# Patient Record
Sex: Male | Born: 1960 | Race: Black or African American | Hispanic: No | Smoking: Current every day smoker
Health system: Southern US, Community
[De-identification: ages and names within clinical notes are randomized; demographics above are authoritative.]

## PROBLEM LIST (undated history)

## (undated) DIAGNOSIS — Z87442 Personal history of urinary calculi: Secondary | ICD-10-CM

## (undated) DIAGNOSIS — Z8659 Personal history of other mental and behavioral disorders: Secondary | ICD-10-CM

## (undated) HISTORY — DX: Personal history of other mental and behavioral disorders: Z86.59

---

## 2010-04-28 ENCOUNTER — Ambulatory Visit: Payer: Self-pay | Admitting: General Practice

## 2010-05-19 ENCOUNTER — Ambulatory Visit: Payer: Self-pay | Admitting: Urology

## 2010-06-01 ENCOUNTER — Ambulatory Visit: Payer: Self-pay | Admitting: Urology

## 2011-08-01 HISTORY — PX: STONE EXTRACTION WITH BASKET: SHX5318

## 2015-02-13 ENCOUNTER — Observation Stay
Admission: EM | Admit: 2015-02-13 | Discharge: 2015-02-13 | Disposition: A | Discharge status: Home / Self Care | Payer: Self-pay | Attending: Internal Medicine | Admitting: Internal Medicine

## 2015-02-13 ENCOUNTER — Encounter: Payer: Self-pay | Admitting: Occupational Medicine

## 2015-02-13 ENCOUNTER — Observation Stay: Payer: Self-pay

## 2015-02-13 ENCOUNTER — Other Ambulatory Visit: Payer: Self-pay

## 2015-02-13 ENCOUNTER — Emergency Department: Payer: Self-pay

## 2015-02-13 DIAGNOSIS — I10 Essential (primary) hypertension: Secondary | ICD-10-CM | POA: Insufficient documentation

## 2015-02-13 DIAGNOSIS — E785 Hyperlipidemia, unspecified: Secondary | ICD-10-CM | POA: Insufficient documentation

## 2015-02-13 DIAGNOSIS — F17200 Nicotine dependence, unspecified, uncomplicated: Secondary | ICD-10-CM | POA: Insufficient documentation

## 2015-02-13 DIAGNOSIS — R079 Chest pain, unspecified: Principal | ICD-10-CM | POA: Insufficient documentation

## 2015-02-13 LAB — BASIC METABOLIC PANEL
ANION GAP: 8 (ref 5–15)
BUN: 11 mg/dL (ref 6–20)
CO2: 26 mmol/L (ref 22–32)
Calcium: 8.8 mg/dL — ABNORMAL LOW (ref 8.9–10.3)
Chloride: 104 mmol/L (ref 101–111)
Creatinine, Ser: 0.99 mg/dL (ref 0.61–1.24)
Glucose, Bld: 118 mg/dL — ABNORMAL HIGH (ref 65–99)
POTASSIUM: 3.7 mmol/L (ref 3.5–5.1)
Sodium: 138 mmol/L (ref 135–145)

## 2015-02-13 LAB — CBC
HEMATOCRIT: 41.2 % (ref 40.0–52.0)
Hemoglobin: 13.7 g/dL (ref 13.0–18.0)
MCH: 30.7 pg (ref 26.0–34.0)
MCHC: 33.2 g/dL (ref 32.0–36.0)
MCV: 92.3 fL (ref 80.0–100.0)
Platelets: 352 10*3/uL (ref 150–440)
RBC: 4.46 MIL/uL (ref 4.40–5.90)
RDW: 14.4 % (ref 11.5–14.5)
WBC: 14.1 10*3/uL — ABNORMAL HIGH (ref 3.8–10.6)

## 2015-02-13 LAB — TROPONIN I
Troponin I: <0.03 ng/mL (ref <0.031)
Troponin I: <0.03 ng/mL (ref <0.031)

## 2015-02-13 LAB — HEMOGLOBIN A1C: Hgb A1c MFr Bld: 5.6 % (ref 4.0–6.0)

## 2015-02-13 LAB — TSH: TSH: 1.804 u[IU]/mL (ref 0.350–4.500)

## 2015-02-13 LAB — LIPASE, BLOOD: Lipase: 19 U/L — ABNORMAL LOW (ref 22–51)

## 2015-02-13 LAB — MAGNESIUM: Magnesium: 1.5 mg/dL — ABNORMAL LOW (ref 1.7–2.4)

## 2015-02-13 MED ORDER — SODIUM CHLORIDE 0.9 % IJ SOLN
3.0000 mL | Freq: Two times a day (BID) | INTRAMUSCULAR | Status: DC
  Administered 2015-02-13: 3 mL via INTRAVENOUS

## 2015-02-13 MED ORDER — ONDANSETRON HCL 4 MG/2ML IJ SOLN: 4.0000 mg | Freq: Four times a day (QID) | INTRAMUSCULAR | Status: DC | PRN

## 2015-02-13 MED ORDER — NITROGLYCERIN 0.4 MG SL SUBL: 0.4000 mg | SUBLINGUAL_TABLET | SUBLINGUAL | Status: DC | PRN

## 2015-02-13 MED ORDER — MORPHINE SULFATE 4 MG/ML IJ SOLN
INTRAMUSCULAR | Status: AC
  Administered 2015-02-13: 4 mg via INTRAVENOUS
  Filled 2015-02-13: qty 1

## 2015-02-13 MED ORDER — MORPHINE SULFATE 2 MG/ML IJ SOLN
2.0000 mg | Freq: Once | INTRAMUSCULAR | Status: AC
Start: 2015-02-13 — End: 2015-02-13
  Administered 2015-02-13: 2 mg via INTRAVENOUS

## 2015-02-13 MED ORDER — ASPIRIN EC 81 MG PO TBEC: 81.0000 mg | DELAYED_RELEASE_TABLET | Freq: Every day | ORAL | Status: DC

## 2015-02-13 MED ORDER — SODIUM CHLORIDE 0.9 % IJ SOLN: 3.0000 mL | INTRAMUSCULAR | Status: DC | PRN

## 2015-02-13 MED ORDER — SODIUM CHLORIDE 0.9 % IV BOLUS (SEPSIS)
1000.0000 mL | Freq: Once | INTRAVENOUS | Status: AC
  Administered 2015-02-13: 1000 mL via INTRAVENOUS

## 2015-02-13 MED ORDER — NICOTINE 14 MG/24HR TD PT24
14.0000 mg | MEDICATED_PATCH | Freq: Every day | TRANSDERMAL | Status: DC
  Administered 2015-02-13: 14 mg via TRANSDERMAL
  Filled 2015-02-13: qty 1

## 2015-02-13 MED ORDER — MORPHINE SULFATE 4 MG/ML IJ SOLN
4.0000 mg | Freq: Once | INTRAMUSCULAR | Status: AC
  Administered 2015-02-13: 4 mg via INTRAVENOUS

## 2015-02-13 MED ORDER — SODIUM CHLORIDE 0.9 % IV SOLN: 250.0000 mL | INTRAVENOUS | Status: DC | PRN

## 2015-02-13 MED ORDER — TECHNETIUM TC 99M SESTAMIBI - CARDIOLITE
28.3000 | Freq: Once | INTRAVENOUS | Status: AC | PRN
  Administered 2015-02-13: 10:00:00 28.3 via INTRAVENOUS

## 2015-02-13 MED ORDER — ONDANSETRON HCL 4 MG/2ML IJ SOLN
INTRAMUSCULAR | Status: AC
  Administered 2015-02-13: 4 mg via INTRAVENOUS
  Filled 2015-02-13: qty 2

## 2015-02-13 MED ORDER — MAGNESIUM OXIDE 400 (241.3 MG) MG PO TABS
800.0000 mg | ORAL_TABLET | Freq: Once | ORAL | Status: AC
  Administered 2015-02-13: 800 mg via ORAL
  Filled 2015-02-13: qty 2

## 2015-02-13 MED ORDER — ACETAMINOPHEN 325 MG PO TABS: 650.0000 mg | ORAL_TABLET | ORAL | Status: DC | PRN

## 2015-02-13 MED ORDER — ASPIRIN 81 MG PO TBEC
81.0000 mg | DELAYED_RELEASE_TABLET | Freq: Every day | ORAL | Status: DC
Start: 2015-02-14 — End: 2017-06-27

## 2015-02-13 MED ORDER — ONDANSETRON HCL 4 MG/2ML IJ SOLN
4.0000 mg | Freq: Once | INTRAMUSCULAR | Status: AC
Start: 2015-02-13 — End: 2015-02-13
  Administered 2015-02-13: 4 mg via INTRAVENOUS

## 2015-02-13 MED ORDER — NICOTINE 14 MG/24HR TD PT24: 14.0000 mg | MEDICATED_PATCH | Freq: Every day | TRANSDERMAL | Status: DC

## 2015-02-13 MED ORDER — MORPHINE SULFATE 2 MG/ML IJ SOLN
INTRAMUSCULAR | Status: AC
  Administered 2015-02-13: 2 mg via INTRAVENOUS
  Filled 2015-02-13: qty 1

## 2015-02-13 MED ORDER — ENOXAPARIN SODIUM 40 MG/0.4ML ~~LOC~~ SOLN
40.0000 mg | SUBCUTANEOUS | Status: DC
  Administered 2015-02-13: 40 mg via SUBCUTANEOUS
  Filled 2015-02-13 (×3): qty 0.4

## 2015-02-13 MED ORDER — PNEUMOCOCCAL VAC POLYVALENT 25 MCG/0.5ML IJ INJ: 0.5000 mL | INJECTION | INTRAMUSCULAR | Status: DC

## 2015-02-13 MED ORDER — ATORVASTATIN CALCIUM 20 MG PO TABS: 40.0000 mg | ORAL_TABLET | Freq: Every day | ORAL | Status: DC

## 2015-02-13 MED ORDER — TECHNETIUM TC 99M SESTAMIBI - CARDIOLITE
13.8000 | Freq: Once | INTRAVENOUS | Status: AC | PRN
  Administered 2015-02-13: 13.8 via INTRAVENOUS

## 2015-02-13 NOTE — Progress Notes (Signed)
Pt in NAD, skin warm and dry, VSS, SR per monitor.  Denies pain or discomfort at this time.  IV and telemetry discontinued per policy and procedure.  Discharge instructions and Rx's given to and reviewed with patient.  Pt  verbalized understanding.  Pt discharged home.

## 2015-02-13 NOTE — ED Notes (Signed)
Pt presents via EMS for mid CP since 2am non radiating denies sob, dizzy, nausea or vomiting. Pt states hurts to breathe feels like air bubble in chest. Pain 7/10. ASA 324mg  given by EMS. V/s stable

## 2015-02-13 NOTE — Progress Notes (Signed)
Chest pain without mi Stress test nomral -no further cardiac wu -ok for dc to home from cardiac standpoint

## 2015-02-13 NOTE — Discharge Instructions (Signed)
Heart healthy diet. °Activity as tolerated. °

## 2015-02-13 NOTE — Consult Note (Signed)
[facility] Cardiology Consultation Note  Patient ID: David Bungert., MRN: [id number], DOB/AGE: 53-30-1962 53 y.o. Admit date: 02/13/2015   Date of Consult: 02/13/2015 Primary Physician: Luna Fuse, MD Primary Cardiologist:khan  Chief Complaint:  Chief Complaint  Patient presents with  . Chest Pain   Reason for Consult: chest pain  HPI: 54 y.o. male with no evidence of significant cardiovascular disease in the past or significant risk factors of cardiovascular disease other than hyperlipidemia who is had new onset of chest discomfort substernal in nature nature radiating into the neck associated with some physical activity although currently at rest. This is lasted for several hours and a nagging pain for which did not go away. The patient has had an EKG on admission to the hospital showing normal sinus rhythm. Normal EKG. Troponin levels were normal and no current evidence of myocardial infarction. The patient has had provement's of symptoms at this time  History reviewed. No pertinent past medical history.    Surgical History: History reviewed. No pertinent past surgical history.   Home Meds: Prior to Admission medications   Not on File    Inpatient Medications:  . [START ON 02/14/2015] aspirin EC  81 mg Oral Daily  . atorvastatin  40 mg Oral q1800  . enoxaparin (LOVENOX) injection  40 mg Subcutaneous Q24H  . nicotine  14 mg Transdermal Daily  . sodium chloride  3 mL Intravenous Q12H      Allergies: No Known Allergies  History   Social History  . Marital Status: Married    Spouse Name: N/A  . Number of Children: N/A  . Years of Education: N/A   Occupational History  . Not on file.   Social History Main Topics  . Smoking status: Current Every Day Smoker -- 0.50 packs/day for 25 years    Types: Cigarettes  . Smokeless tobacco: Not on file  . Alcohol Use: No  . Drug Use: No  . Sexual Activity: Not on file   Other Topics Concern  . Not on file    Social History Narrative  . No narrative on file     Family History  Problem Relation Age of Onset  . Heart failure Mother      Review of Systems Positive for chest pain Negative for: General:  chills, fever, night sweats or weight changes.  Cardiovascular: PND orthopnea syncope dizziness  Dermatological skin lesions rashes Respiratory: Cough congestion Urologic: Frequent urination urination at night and hematuria Abdominal: negative for nausea, vomiting, diarrhea, bright red blood per rectum, melena, or hematemesis Neurologic: negative for visual changes, and/or hearing changes  All other systems reviewed and are otherwise negative except as noted above.  Labs:  Recent Labs  02/13/15 0524  TROPONINI <0.03   Lab Results  Component Value Date   WBC 14.1* 02/13/2015   HGB 13.7 02/13/2015   HCT 41.2 02/13/2015   MCV 92.3 02/13/2015   PLT 352 02/13/2015    Recent Labs Lab 02/13/15 0524  NA 138  K 3.7  CL 104  CO2 26  BUN 11  CREATININE 0.99  CALCIUM 8.8*  GLUCOSE 118*   No results found for: CHOL, HDL, LDLCALC, TRIG No results found for: DDIMER  Radiology/Studies:  Dg Chest 2 View  02/13/2015   CLINICAL DATA:  Mid chest pain beginning yesterday, indigestion.  EXAM: CHEST  2 VIEW  COMPARISON:  None.  FINDINGS: Cardiac silhouette is normal in size, mediastinal silhouette is nonsuspicious. Bronchitic changes, confluent in the lung  bases. No pleural effusion. No pneumothorax. Soft tissue planes and included osseous structure nonsuspicious.  IMPRESSION: Bronchitic changes, confluent in the lung bases which can be seen with fibrosis, atelectasis or less likely pneumonia.   Electronically Signed   By: Awilda Metroourtnay  Bloomer M.D.   On: 02/13/2015 05:55    EKG: Normal sinus rhythm. Normal EKG  Weights: Filed Weights   02/13/15 0537  Weight: 160 lb (72.576 kg)     Physical Exam: Blood pressure 119/95, pulse 93, temperature 97.9 F (36.6 C), temperature source Oral,  resp. rate 20, height 5\' 9"  (1.753 m), weight 160 lb (72.576 kg), SpO2 100 %. Body mass index is 23.62 kg/(m^2). General: Well developed, well nourished, in no acute distress. Head eyes ears nose throat: Normocephalic, atraumatic, sclera non-icteric, no xanthomas, nares are without discharge. No apparent thyromegaly and/or mass  Lungs: Normal respiratory effort.  no wheezes, no rales, no rhonchi.  Heart: RRR with normal S1 S2. no murmur gallop, no rub, PMI is normal size and placement, carotid upstroke normal without bruit, jugular venous pressure is normal Abdomen: Soft, non-tender, non-distended with normoactive bowel sounds. No hepatomegaly. No rebound/guarding. No obvious abdominal masses. Abdominal aorta is normal size without bruit Extremities: No edema. no cyanosis, no clubbing, no ulcers  Peripheral : 2+ bilateral upper extremity pulses, 2+ bilateral femoral pulses, 2+ bilateral dorsal pedal pulse Neuro: Alert and oriented. No facial asymmetry. No focal deficit. Moves all extremities spontaneously. Musculoskeletal: Normal muscle tone without kyphosis Psych:  Responds to questions appropriately with a normal affect.    Assessment: 54 year old male with known hyperlipidemia Hypertension with acute onset of atypical chest discomfort without current evidence of myocardial infarction  Plan: 1. Aspirin for further risk reduction cardiovascular event 2. Serial ECG and enzymes to assess for myocardial infarction 3. Consider treadmill stress test to assess for myocardial ischemia 4. High intensity cholesterol therapy with atorvastatin 5. Further diagnostic testing and treatment options after above  Signed, Lamar BlinksKOWALSKI,Danon Lograsso J M.D. [facility] 02/13/2015, 9:38 AM

## 2015-02-13 NOTE — H&P (Signed)
[facility] [place] [facility]   PATIENT NAME: David Duffy    MR#:  [id number]  DATE OF BIRTH:  Aug 29, 1960  DATE OF ADMISSION:  02/13/2015  PRIMARY CARE PHYSICIAN: Luna FuseEJAN-SIE, SHEIKH AHMED, MD   REQUESTING/REFERRING PHYSICIAN: Darci Currentandolph N Brown, MD  CHIEF COMPLAINT:   Chief Complaint  Patient presents with  . Chest Pain  chest pain today.  HISTORY OF PRESENT ILLNESS:  David Duffy  is a 54 y.o. male with no medical history. Chest pain since 2 am today, which is nagging, substernal area, constant without radiation, exacerbated by deep breathing. He denies any other symptoms. Dr. Manson PasseyBrown said the patient has heart murmurs. He needs to be admitted to rule out ACS. The patient has stress due to family issue recently.  PAST MEDICAL HISTORY:  History reviewed. No pertinent past medical history. No  PAST SURGICAL HISTORY:  History reviewed. No pertinent past surgical history. No  SOCIAL HISTORY:   History  Substance Use Topics  . Smoking status: Current Every Day Smoker -- 0.50 packs/day for 25 years    Types: Cigarettes  . Smokeless tobacco: Not on file  . Alcohol Use: No    FAMILY HISTORY:   Family History  Problem Relation Age of Onset  . Heart failure Mother   Father: prostate cancer.  DRUG ALLERGIES:  No Known Allergies  REVIEW OF SYSTEMS:  CONSTITUTIONAL: No fever, fatigue or weakness.  EYES: No blurred or double vision.  EARS, NOSE, AND THROAT: No tinnitus or ear pain.  RESPIRATORY: No cough, shortness of breath, wheezing or hemoptysis.  CARDIOVASCULAR: Haschest pain, no orthopnea, edema.  GASTROINTESTINAL: No nausea, vomiting, diarrhea or abdominal pain.  GENITOURINARY: No dysuria, hematuria.  ENDOCRINE: No polyuria, nocturia,  HEMATOLOGY: No anemia, easy bruising or bleeding SKIN: No rash or lesion. MUSCULOSKELETAL: No joint pain or arthritis.   NEUROLOGIC: No tingling, numbness, weakness.  PSYCHIATRY: No anxiety or depression.    MEDICATIONS AT HOME:   Prior to Admission medications   Not on File      VITAL SIGNS:  Blood pressure 119/95, pulse 93, temperature 97.9 F (36.6 C), temperature source Oral, resp. rate 20, height 5\' 9"  (1.753 m), weight 72.576 kg (160 lb), SpO2 100 %.  PHYSICAL EXAMINATION:  GENERAL:  54 y.o.-year-old patient lying in the bed with no acute distress.  EYES: Pupils equal, round, reactive to light and accommodation. No scleral icterus. Extraocular muscles intact.  HEENT: Head atraumatic, normocephalic. Oropharynx and nasopharynx clear.  NECK:  Supple, no jugular venous distention. No thyroid enlargement, no tenderness.  LUNGS: Normal breath sounds bilaterally, no wheezing, rales,rhonchi or crepitation. No use of accessory muscles of respiration.  CARDIOVASCULAR: S1, S2 normal. No murmurs, rubs, or gallops.  ABDOMEN: Soft, nontender, nondistended. Bowel sounds present. No organomegaly or mass.  EXTREMITIES: No pedal edema, cyanosis, or clubbing.  NEUROLOGIC: Cranial nerves II through XII are intact. Muscle strength 5/5 in all extremities. Sensation intact. Gait not checked.  PSYCHIATRIC: The patient is alert and oriented x 3.  SKIN: No obvious rash, lesion, or ulcer.   LABORATORY PANEL:   CBC  Recent Labs Lab 02/13/15 0604  WBC 14.1*  HGB 13.7  HCT 41.2  PLT 352   ------------------------------------------------------------------------------------------------------------------  Chemistries   Recent Labs Lab 02/13/15 0524  NA 138  K 3.7  CL 104  CO2 26  GLUCOSE 118*  BUN 11  CREATININE 0.99  CALCIUM 8.8*   ------------------------------------------------------------------------------------------------------------------  Cardiac Enzymes  Recent Labs Lab 02/13/15 0524  TROPONINI <  0.03   ------------------------------------------------------------------------------------------------------------------  RADIOLOGY:  Dg Chest 2 View  02/13/2015   CLINICAL  DATA:  Mid chest pain beginning yesterday, indigestion.  EXAM: CHEST  2 VIEW  COMPARISON:  None.  FINDINGS: Cardiac silhouette is normal in size, mediastinal silhouette is nonsuspicious. Bronchitic changes, confluent in the lung bases. No pleural effusion. No pneumothorax. Soft tissue planes and included osseous structure nonsuspicious.  IMPRESSION: Bronchitic changes, confluent in the lung bases which can be seen with fibrosis, atelectasis or less likely pneumonia.   Electronically Signed   By: Awilda Metro M.D.   On: 02/13/2015 05:55    EKG:   Orders placed or performed during the hospital encounter of 02/13/15  . ED EKG within 10 minutes  . ED EKG within 10 minutes    IMPRESSION AND PLAN:   Chest pain, R/O ACS Leukocytosis Tobacco abuse.  Tele monitor, follow up troponin, continue ASA, start lipitor and lopressor. Get stress test, echo and cardiology consult. Smoking cessation was counselled for 4 min.   All the records are reviewed and case discussed with ED provider. Management plans discussed with the patient, his wife and they are in agreement.  CODE STATUS: Full Code.  TOTAL TIME TAKING CARE OF THIS PATIENT: 48  minutes.    Shaune Pollack M.D on 02/13/2015 at 7:35 AM  Between 7am to 6pm - Pager - [phone]  After 6pm go to [email or web address] - password [email or web address] [facility]  [facility]  Office  [phone]  CC: Primary care physician; Luna Fuse, MD

## 2015-02-13 NOTE — Discharge Summary (Addendum)
[facility] - [place] at [facility]   PATIENT NAME: David Duffy    MR#:  [id number]  DATE OF BIRTH:  1960-11-17  DATE OF ADMISSION:  02/13/2015 ADMITTING PHYSICIAN: Shaune Pollack, MD  DATE OF DISCHARGE: 02/13/2015 PRIMARY CARE PHYSICIAN: Luna Fuse, MD    ADMISSION DIAGNOSIS:  Chest pain [R07.9] Chest pain, unspecified chest pain type [R07.9]   DISCHARGE DIAGNOSIS:  Atypical chest pain Hypomagnesemia Tobacco abuse.  SECONDARY DIAGNOSIS:  History reviewed. No pertinent past medical history.  HOSPITAL COURSE:   The patient was admitted for chest pain this morning. He was treated with aspirin and Lipitor. Troponins are negative. Stress test is negative. According to Dr. Gwen Pounds, patient can be discharged to home today. Patient's magnesium is 1.5. He is treated with magnesium supplement. Smoking cessation was counseled and he was given nicotine patch. DISCHARGE CONDITIONS:   Stable.  CONSULTS OBTAINED:  Treatment Team:  Lamar Blinks, MD  DRUG ALLERGIES:  No Known Allergies  DISCHARGE MEDICATIONS:   Current Discharge Medication List    START taking these medications   Details  aspirin EC 81 MG EC tablet Take 1 tablet (81 mg total) by mouth daily. Qty: 30 tablet, Refills: 0    nicotine (NICODERM CQ - DOSED IN MG/24 HOURS) 14 mg/24hr patch Place 1 patch (14 mg total) onto the skin daily. Qty: 28 patch, Refills: 0         DISCHARGE INSTRUCTIONS:    If you experience worsening of your admission symptoms, develop shortness of breath, life threatening emergency, suicidal or homicidal thoughts you must seek medical attention immediately by calling 911 or calling your MD immediately  if symptoms less severe.  You Must read complete instructions/literature along with all the possible adverse reactions/side effects for all the Medicines you take and that have been prescribed to you. Take any new Medicines after you have completely  understood and accept all the possible adverse reactions/side effects.   Please note  You were cared for by a hospitalist during your hospital stay. If you have any questions about your discharge medications or the care you received while you were in the hospital after you are discharged, you can call the unit and asked to speak with the hospitalist on call if the hospitalist that took care of you is not available. Once you are discharged, your primary care physician will handle any further medical issues. Please note that NO REFILLS for any discharge medications will be authorized once you are discharged, as it is imperative that you return to your primary care physician (or establish a relationship with a primary care physician if you do not have one) for your aftercare needs so that they can reassess your need for medications and monitor your lab values.    Today   SUBJECTIVE   No complaint.   VITAL SIGNS:  Blood pressure 107/64, pulse 85, temperature 100 F (37.8 C), temperature source Oral, resp. rate 20, height  (1.778 m), weight 76.749 kg (169 lb 3.2 oz), SpO2 98 %.  I/O:  No intake or output data in the 24 hours ending 02/13/15 1337  PHYSICAL EXAMINATION:  GENERAL:  54 y.o.-year-old patient lying in the bed with no acute distress.  EYES: Pupils equal, round, reactive to light and accommodation. No scleral icterus. Extraocular muscles intact.  HEENT: Head atraumatic, normocephalic. Oropharynx and nasopharynx clear.  NECK:  Supple, no jugular venous distention. No thyroid enlargement, no tenderness.  LUNGS: Normal breath sounds bilaterally, no wheezing,  rales,rhonchi or crepitation. No use of accessory muscles of respiration.  CARDIOVASCULAR: S1, S2 normal. No murmurs, rubs, or gallops.  ABDOMEN: Soft, non-tender, non-distended. Bowel sounds present. No organomegaly or mass.  EXTREMITIES: No pedal edema, cyanosis, or clubbing.  NEUROLOGIC: Cranial nerves II through XII are  intact. Muscle strength 5/5 in all extremities. Sensation intact. Gait not checked.  PSYCHIATRIC: The patient is alert and oriented x 3.  SKIN: No obvious rash, lesion, or ulcer.   DATA REVIEW:   CBC  Recent Labs Lab 02/13/15 0604  WBC 14.1*  HGB 13.7  HCT 41.2  PLT 352    Chemistries   Recent Labs Lab 02/13/15 0524 02/13/15 0836  NA 138  --   K 3.7  --   CL 104  --   CO2 26  --   GLUCOSE 118*  --   BUN 11  --   CREATININE 0.99  --   CALCIUM 8.8*  --   MG  --  1.5*    Cardiac Enzymes  Recent Labs Lab 02/13/15 0836  TROPONINI <0.03    Microbiology Results  No results found for this or any previous visit.  RADIOLOGY:  Dg Chest 2 View  02/13/2015   CLINICAL DATA:  Mid chest pain beginning yesterday, indigestion.  EXAM: CHEST  2 VIEW  COMPARISON:  None.  FINDINGS: Cardiac silhouette is normal in size, mediastinal silhouette is nonsuspicious. Bronchitic changes, confluent in the lung bases. No pleural effusion. No pneumothorax. Soft tissue planes and included osseous structure nonsuspicious.  IMPRESSION: Bronchitic changes, confluent in the lung bases which can be seen with fibrosis, atelectasis or less likely pneumonia.   Electronically Signed   By: Awilda Metroourtnay  Bloomer M.D.   On: 02/13/2015 05:55        Management plans discussed with the patient, family and they are in agreement.  CODE STATUS:     Code Status Orders        Start     Ordered   02/13/15 0820  Full code   Continuous     02/13/15 0819      TOTAL TIME TAKING CARE OF THIS PATIENT: 33 minutes.    Shaune Pollackhen, Dorissa Stinnette M.D on 02/13/2015 at 1:37 PM  Between 7am to 6pm - Pager - [phone]  After 6pm go to [email or web address] - password [email or web address] [facility]  [facility]  Office  [phone]  CC: Primary care physician; Luna FuseEJAN-SIE, SHEIKH AHMED, MD

## 2015-02-13 NOTE — ED Provider Notes (Signed)
[facility] Emergency Department Provider Note  ____________________________________________  Time seen: 6:00 AM  I have reviewed the triage vital signs and the nursing notes.   HISTORY  Chief Complaint Chest Pain      HPI David Greavesndrew J Marlett Sr. is a 54 y.o. male presents with acute onset of chest pain at approximately 2 AM this morning that is nonradiating. Patient describes the pain as pressure 10 out of 10. Patient denies any dyspnea no nausea no vomiting or dizziness. Patient was given aspirin during 24 mg by EMS prior to presentation. Patient denies any past medical history     Past medical history None  Past surgical history None  Allergies Review of patient's allergies indicates no known allergies.  Family History  Problem Relation Age of Onset  . Heart failure Mother     Social History History  Substance Use Topics  . Smoking status: Not on file  . Smokeless tobacco: Not on file  . Alcohol Use: Not on file    Review of Systems  Constitutional: Negative for fever. Eyes: Negative for visual changes. ENT: Negative for sore throat. Cardiovascular: Positive for chest pain. Respiratory: Negative for shortness of breath. Gastrointestinal: Negative for abdominal pain, vomiting and diarrhea. Genitourinary: Negative for dysuria. Musculoskeletal: Negative for back pain. Skin: Negative for rash. Neurological: Negative for headaches, focal weakness or numbness.   10-point ROS otherwise negative.  ____________________________________________   PHYSICAL EXAM:  VITAL SIGNS: ED Triage Vitals  Enc Vitals Group     BP 02/13/15 0537 151/88 mmHg     Pulse Rate 02/13/15 0537 73     Resp 02/13/15 0537 18     Temp 02/13/15 0537 97.9 F (36.6 C)     Temp Source 02/13/15 0537 Oral     SpO2 02/13/15 0537 99 %     Weight 02/13/15 0537 160 lb (72.576 kg)     Height 02/13/15 0537 5\' 9"  (1.753 m)     Head Cir --      Peak Flow --      Pain  Score 02/13/15 0538 7     Pain Loc --      Pain Edu? --      Excl. in GC? --     Constitutional: Alert and oriented. Well appearing and in no distress. Eyes: Conjunctivae are normal. PERRL. Normal extraocular movements. ENT   Head: Normocephalic and atraumatic.   Nose: No congestion/rhinnorhea.   Mouth/Throat: Mucous membranes are moist.   Neck: No stridor. Hematological/Lymphatic/Immunilogical: No cervical lymphadenopathy. Cardiovascular: Normal rate, regular rhythm. Normal and symmetric distal pulses are present in all extremities. Grade 3 crescendo decrescendo murmur noted right sternal border Respiratory: Normal respiratory effort without tachypnea nor retractions. Breath sounds are clear and equal bilaterally. No wheezes/rales/rhonchi. Gastrointestinal: Soft and nontender. No distention. There is no CVA tenderness. Genitourinary: deferred Musculoskeletal: Nontender with normal range of motion in all extremities. No joint effusions.  No lower extremity tenderness nor edema. Neurologic:  Normal speech and language. No gross focal neurologic deficits are appreciated. Speech is normal.  Skin:  Skin is warm, dry and intact. No rash noted. Psychiatric: Mood and affect are normal. Speech and behavior are normal. Patient exhibits appropriate insight and judgment.  ____________________________________________    LABS (pertinent positives/negatives)    ____________________________________________   EKG  ED ECG REPORT I, Mataya Kilduff, Vickery N, the attending physician, personally viewed and interpreted this ECG.   Date: 02/13/2015  EKG Time: 5:22 AM  Rate: 64  Rhythm: Normal sinus  rhythm  Axis: Normal  Intervals: Normal  ST&T Change: None   ____________________________________________    RADIOLOGY  Chest x-ray revealed:  IMPRESSION: Bronchitic changes, confluent in the lung bases which can be seen with fibrosis, atelectasis or less likely  pneumonia.   Electronically Signed By: Awilda Metro M.D.   INITIAL IMPRESSION / ASSESSMENT AND PLAN / ED COURSE  Pertinent labs & imaging results that were available during my care of the patient were reviewed by me and considered in my medical decision making (see chart for details).  Given history of physical exam (new diagnosis of a murmur) concern for cardiac etiology of the patient's chest pain as such patient will be admitted to Dr. Imogene Duffy for further cardiac evaluation.  ____________________________________________   FINAL CLINICAL IMPRESSION(S) / ED DIAGNOSES  Final diagnoses:  Chest pain, unspecified chest pain type      Darci Current, MD 02/13/15 [phone]

## 2015-03-09 LAB — NM MYOCAR MULTI W/SPECT W/WALL MOTION / EF
CHL CUP NUCLEAR SRS: 0
CHL CUP NUCLEAR SSS: 0
LV dias vol: 109 mL
LV sys vol: 47 mL
TID: 1

## 2016-03-31 DIAGNOSIS — Z8659 Personal history of other mental and behavioral disorders: Secondary | ICD-10-CM

## 2016-03-31 HISTORY — DX: Personal history of other mental and behavioral disorders: Z86.59

## 2016-11-06 IMAGING — CR DG CHEST 2V
2 series · 2 of 2 positions shown · non-contrast
Comparison: None.

CLINICAL DATA: Mid chest pain beginning yesterday, indigestion.

EXAM:
CHEST  2 VIEW

[chest pa]
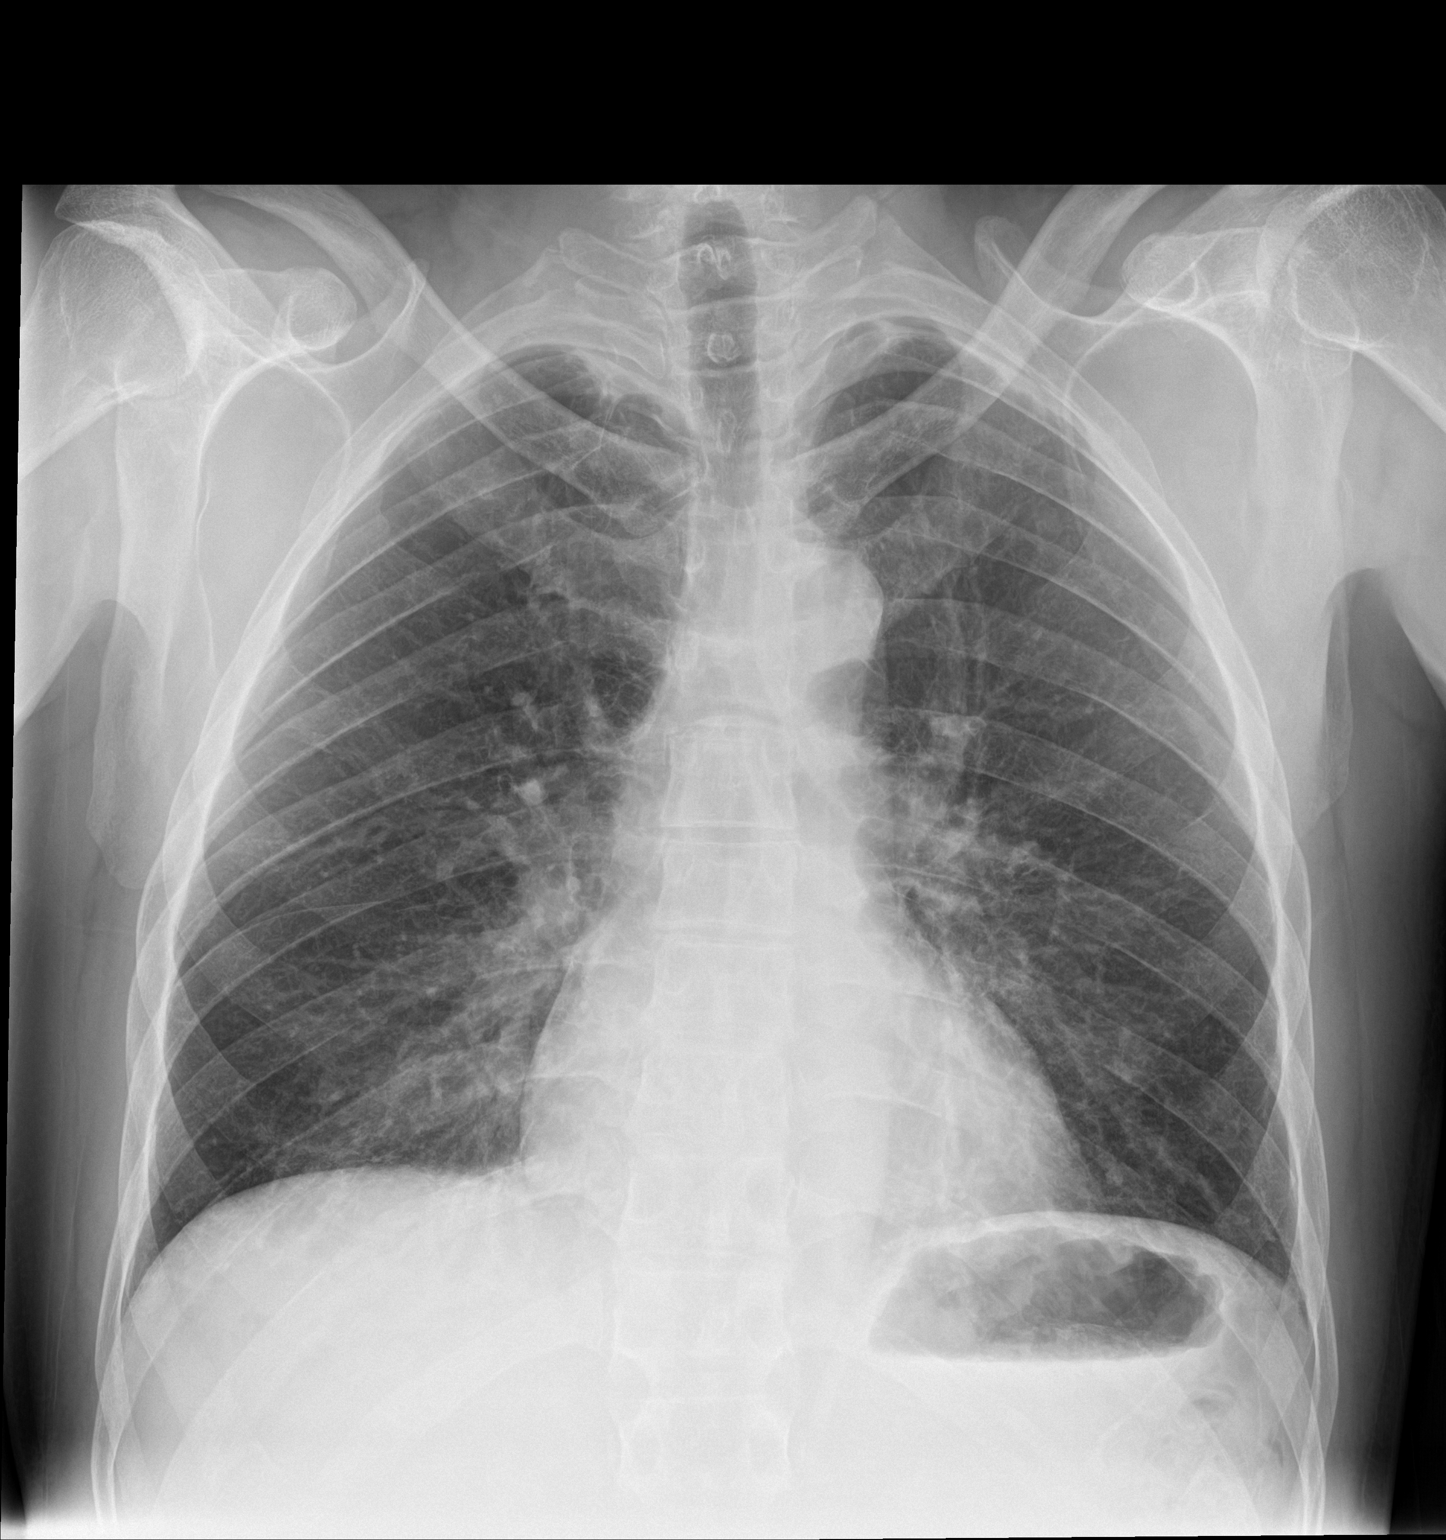

[chest lat]
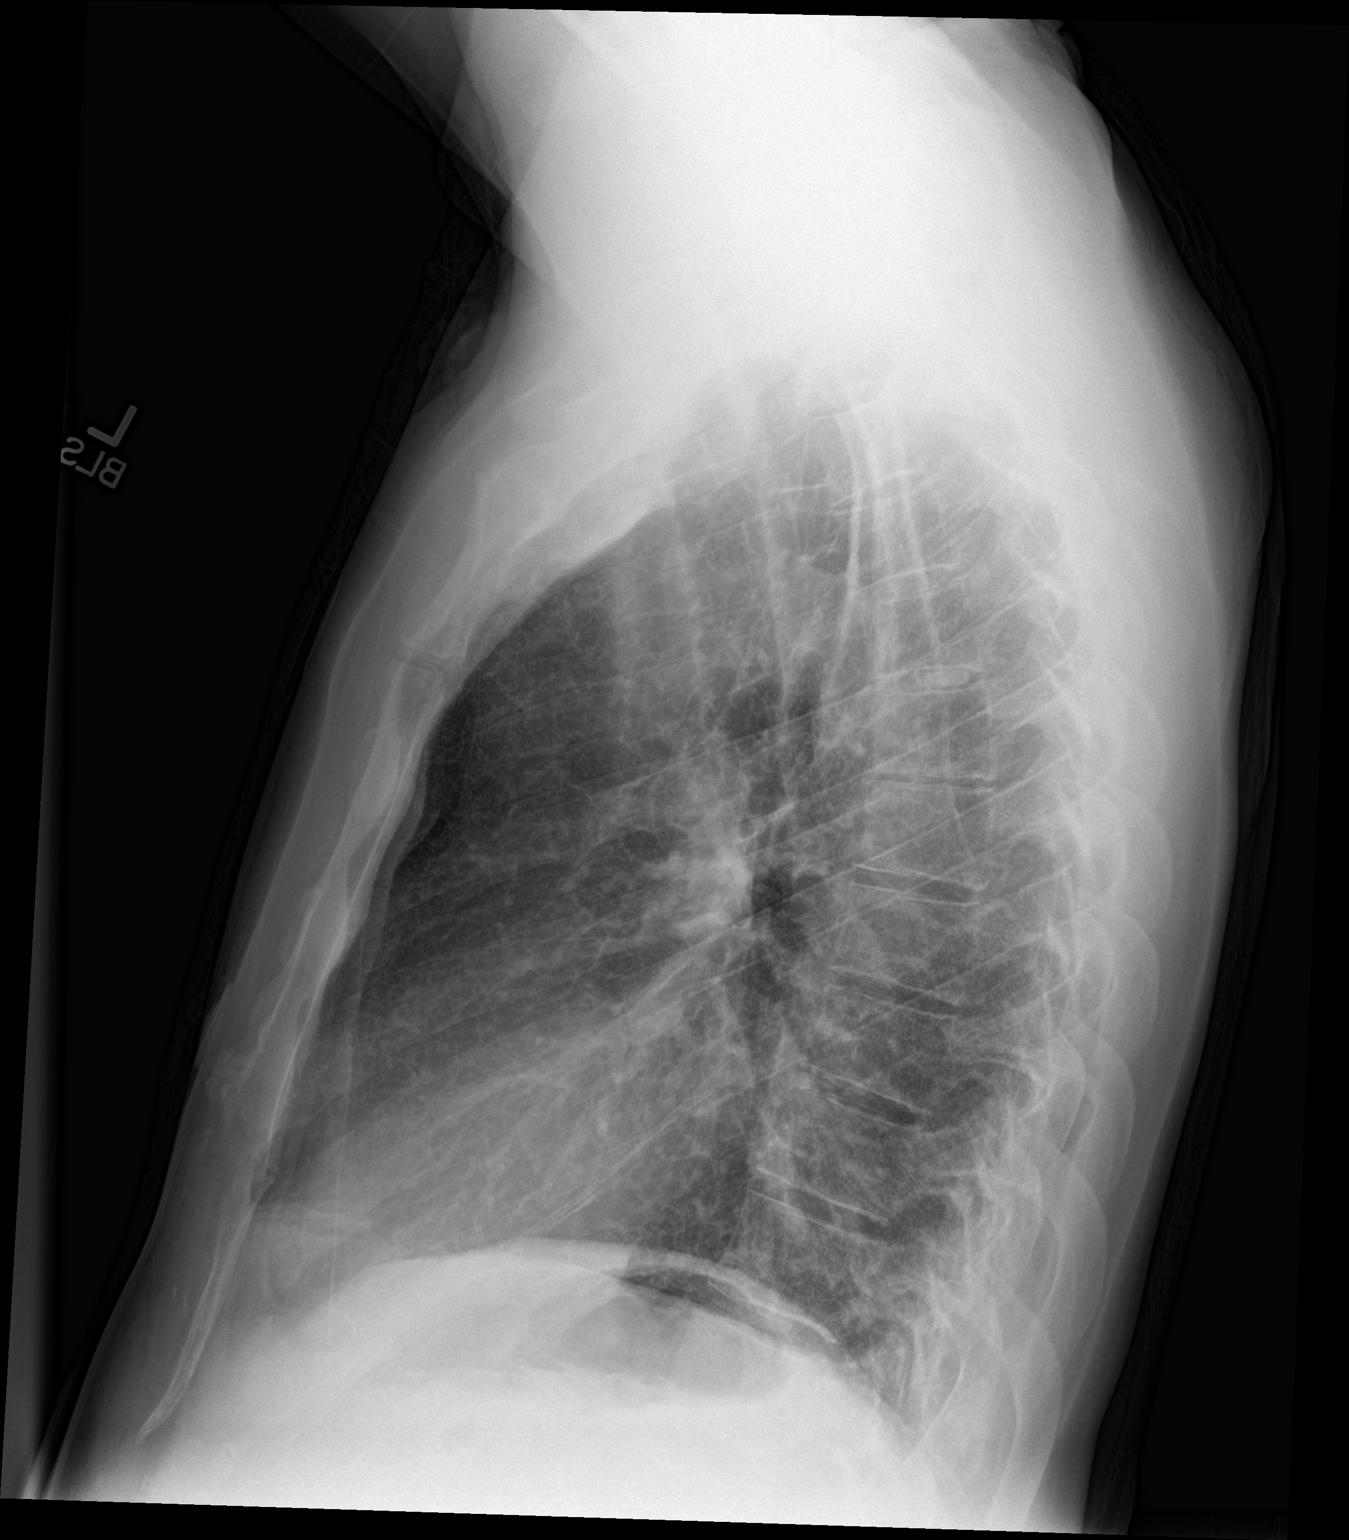

[2 of 2 positions shown; findings below may reference images not displayed]

FINDINGS: Cardiac silhouette is normal in size, mediastinal silhouette is
nonsuspicious. Bronchitic changes, confluent in the lung bases. No
pleural effusion. No pneumothorax. Soft tissue planes and included
osseous structure nonsuspicious.
IMPRESSION: Bronchitic changes, confluent in the lung bases which can be seen
with fibrosis, atelectasis or less likely pneumonia.

## 2017-06-12 ENCOUNTER — Encounter: Payer: Self-pay | Admitting: Urology

## 2017-06-12 ENCOUNTER — Ambulatory Visit (INDEPENDENT_AMBULATORY_CARE_PROVIDER_SITE_OTHER): Payer: 59 | Admitting: Urology

## 2017-06-12 ENCOUNTER — Telehealth: Payer: Self-pay | Admitting: Urology

## 2017-06-12 VITALS — BP 129/85 | HR 102 | Ht 70.0 in | Wt 152.5 lb

## 2017-06-12 DIAGNOSIS — N210 Calculus in bladder: Secondary | ICD-10-CM | POA: Diagnosis not present

## 2017-06-12 DIAGNOSIS — R310 Gross hematuria: Secondary | ICD-10-CM

## 2017-06-12 LAB — URINALYSIS, COMPLETE
BILIRUBIN UA: NEGATIVE
Glucose, UA: NEGATIVE
Ketones, UA: NEGATIVE
NITRITE UA: NEGATIVE
Specific Gravity, UA: 1.02 (ref 1.005–1.030)
Urobilinogen, Ur: 0.2 mg/dL (ref 0.2–1.0)
pH, UA: 7.5 (ref 5.0–7.5)

## 2017-06-12 LAB — MICROSCOPIC EXAMINATION

## 2017-06-12 NOTE — Telephone Encounter (Signed)
[facility] can come [facility]  David DusterMichelle

## 2017-06-12 NOTE — Telephone Encounter (Signed)
Patient will need to be scheduled for a prostate volume study with Dr. Apolinar JunesBrandon.  It will need to be scheduled like a prostate biopsy.

## 2017-06-12 NOTE — Progress Notes (Signed)
06/12/2017 8:26 PM   David CrutchAndrew J Ishman Sr. 20-Nov-1960 [id number]  Referring provider: Sherron Mondayejan-Sie, S Ahmed, MD [address] [place], [place] [address]  Chief Complaint  Patient presents with  . Nephrolithiasis    HPI: Patient is a Caucasian male who is referred by his PCP, Meryl Crutchhelsea Boswell ANP for a bladder stone.  He states one week ago he was having trouble urinating, had to strain to urinate and drops of blood in the urine.  He also had pain in the suprapubic region.   The pain is described as a moderate burning.  Nothing helps the pain.  Nothing makes the pain worse.    He has had a history of bladder stones.  He describes a procedure performed by Dr. Lonna CobbStoioff which sounds like a cystolithopaxy.   He has not had fevers, chills, nausea or vomiting.  He no longer having gross hematuria, nocturia and dysuria.  His UA was positive for 0-5 WBC's, 11-30 RBC's and moderate bacteria.    Non contrast CT performed at [facility] on 06/08/2017 was positive for emphysema, right renal cyst, 19 mm bladder calculus, bladder wall thickening, enlarged prostate gland, enlargement of the seminal vesicles and a benign appearing lesion in the left ilium.  PMH: Past Medical History:  Diagnosis Date  . History of suicidal ideation 03/2016   After wife passed away  . Kidney stone     Surgical History: Past Surgical History:  Procedure Laterality Date  . STONE EXTRACTION WITH BASKET  2013    Home Medications:  Allergies as of 06/12/2017   No Known Allergies     Medication List        Accurate as of 06/12/17 11:59 PM. Always use your most recent med list.          aspirin 81 MG EC tablet Take 1 tablet (81 mg total) by mouth daily.   nicotine 14 mg/24hr patch Commonly known as:  NICODERM CQ - dosed in mg/24 hours Place 1 patch (14 mg total) onto the skin daily.       Allergies: No Known Allergies  Family History: Family History  Problem Relation Age of Onset  . Heart  failure Mother   . Prostate cancer Father     Social History:  reports that he has been smoking cigarettes.  He has been smoking about 1.00 pack per day. he has never used smokeless tobacco. He reports that he does not drink alcohol or use drugs.  ROS: UROLOGY Frequent Urination?: Yes Hard to postpone urination?: No Burning/pain with urination?: Yes Get up at night to urinate?: Yes Leakage of urine?: No Urine stream starts and stops?: No Trouble starting stream?: No Do you have to strain to urinate?: No Blood in urine?: Yes Urinary tract infection?: Yes Sexually transmitted disease?: No Injury to kidneys or bladder?: No Painful intercourse?: No Weak stream?: No Erection problems?: No Penile pain?: No  Gastrointestinal Nausea?: No Vomiting?: No Indigestion/heartburn?: No Diarrhea?: No Constipation?: No  Constitutional Fever: No Night sweats?: No Weight loss?: No Fatigue?: No  Skin Skin rash/lesions?: No Itching?: No  Eyes Blurred vision?: No Double vision?: No  Ears/Nose/Throat Sore throat?: No Sinus problems?: No  Hematologic/Lymphatic Swollen glands?: No Easy bruising?: No  Cardiovascular Leg swelling?: No Chest pain?: No  Respiratory Cough?: No Shortness of breath?: No  Endocrine Excessive thirst?: No  Musculoskeletal Back pain?: No Joint pain?: No  Neurological Headaches?: No Dizziness?: No  Psychologic Depression?: No Anxiety?: No  Physical Exam: BP 129/85  Pulse (!) 102   Ht 5\' 10"  (1.778 m)   Wt 152 lb 8 oz (69.2 kg)   BMI 21.88 kg/m   Constitutional: Well nourished. Alert and oriented, No acute distress. HEENT: [place] AT, moist mucus membranes. Trachea midline, no masses. Cardiovascular: No clubbing, cyanosis, or edema. Respiratory: Normal respiratory effort, no increased work of breathing. GI: Abdomen is soft, non tender, non distended, no abdominal masses. Liver and spleen not palpable.  No hernias appreciated.  Stool  sample for occult testing is not indicated.   GU: No CVA tenderness.  No bladder fullness or masses.  Patient with circumcised phallus.  Urethral meatus is patent.  No penile discharge. No penile lesions or rashes. Scrotum without lesions, cysts, rashes and/or edema.  Testicles are located scrotally bilaterally. No masses are appreciated in the testicles. Left and right epididymis are normal. Rectal: Patient with  normal sphincter tone. Anus and perineum without scarring or rashes. No rectal masses are appreciated. Prostate is approximately 70 grams, no nodules are appreciated. Seminal vesicles are normal. Skin: No rashes, bruises or suspicious lesions. Lymph: No cervical or inguinal adenopathy. Neurologic: Grossly intact, no focal deficits, moving all 4 extremities. Psychiatric: Normal mood and affect.  Laboratory Data: Urinalysis 0-5 WBC's.  11-30 RBC's.  Moderate bacteria.  See Epic.   I have reviewed the labs.   Pertinent Imaging: Non contrast CT scan performed on 06/08/2017 performed at [facility] noted emphysema, right renal cyst, 19 mm bladder calculus, bladder wall thickening, enlarged prostate gland, enlargement of the seminal vesicles and a benign appearing lesion in the left ilium - images reviewed by me and Dr. Apolinar JunesBrandon (artifact seen on CT due to deflection of a cell phone in his pocket) unable to perform measurements of the prostate volume due to this    Assessment & Plan:    1. Bladder stone  - patient will need to have a volume study performed of his prostate for further evaluation of prostate size, this is necessary to decide on what procedure is indicated to address the bladder stones   2. Gross hematuria  - may be due to bladder stones  - We will continue to monitor the patient's UA after the treatment of the stone to ensure the hematuria has resolved.  If hematuria persists, we will pursue a hematuria workup with CT Urogram and cystoscopy if appropriate.  3. BPH  with LU TS  - due to bladder stones patient will most likely undergo a bladder outlet procedure in the future     Return for schedule TRUSP for prostate volume.  These notes generated with voice recognition software. I apologize for typographical errors.  Michiel CowboySHANNON Derren Suydam, PA-C  [facility] [address], [address] [place], [place] [address] [phone]

## 2017-06-13 LAB — BASIC METABOLIC PANEL
BUN / CREAT RATIO: 13 (ref 9–20)
BUN: 12 mg/dL (ref 6–24)
CO2: 24 mmol/L (ref 20–29)
Calcium: 9.4 mg/dL (ref 8.7–10.2)
Chloride: 106 mmol/L (ref 96–106)
Creatinine, Ser: 0.94 mg/dL (ref 0.76–1.27)
GFR calc Af Amer: 105 mL/min/{1.73_m2} (ref >59)
GFR, EST NON AFRICAN AMERICAN: 91 mL/min/{1.73_m2} (ref >59)
Glucose: 91 mg/dL (ref 65–99)
Potassium: 4.4 mmol/L (ref 3.5–5.2)
Sodium: 144 mmol/L (ref 134–144)

## 2017-06-13 LAB — CBC WITH DIFFERENTIAL/PLATELET
BASOS: 0 %
Basophils Absolute: 0 10*3/uL (ref 0.0–0.2)
EOS (ABSOLUTE): 0.1 10*3/uL (ref 0.0–0.4)
Eos: 1 %
Hematocrit: 39.7 % (ref 37.5–51.0)
Hemoglobin: 13.9 g/dL (ref 13.0–17.7)
Immature Grans (Abs): 0 10*3/uL (ref 0.0–0.1)
Immature Granulocytes: 0 %
LYMPHS: 30 %
Lymphocytes Absolute: 2.7 10*3/uL (ref 0.7–3.1)
MCH: 33.3 pg — ABNORMAL HIGH (ref 26.6–33.0)
MCHC: 35 g/dL (ref 31.5–35.7)
MCV: 95 fL (ref 79–97)
Monocytes Absolute: 0.7 10*3/uL (ref 0.1–0.9)
Monocytes: 8 %
Neutrophils Absolute: 5.4 10*3/uL (ref 1.4–7.0)
Neutrophils: 61 %
PLATELETS: 372 10*3/uL (ref 150–379)
RBC: 4.17 x10E6/uL (ref 4.14–5.80)
RDW: 14.8 % (ref 12.3–15.4)
WBC: 8.9 10*3/uL (ref 3.4–10.8)

## 2017-06-14 LAB — CULTURE, URINE COMPREHENSIVE

## 2017-06-18 ENCOUNTER — Ambulatory Visit: Payer: Self-pay | Admitting: Urology

## 2017-06-19 ENCOUNTER — Ambulatory Visit: Payer: Self-pay | Admitting: Urology

## 2017-06-22 ENCOUNTER — Telehealth: Payer: Self-pay | Admitting: Urology

## 2017-06-22 NOTE — Telephone Encounter (Signed)
Would you please send my note to Meryl Crutchhelsea Boswell, ANP at [facility]?

## 2017-06-27 ENCOUNTER — Encounter: Payer: Self-pay | Admitting: Urology

## 2017-06-27 ENCOUNTER — Ambulatory Visit (INDEPENDENT_AMBULATORY_CARE_PROVIDER_SITE_OTHER): Payer: 59 | Admitting: Urology

## 2017-06-27 VITALS — BP 139/76 | HR 90 | Ht 70.0 in | Wt 156.2 lb

## 2017-06-27 DIAGNOSIS — N401 Enlarged prostate with lower urinary tract symptoms: Secondary | ICD-10-CM | POA: Diagnosis not present

## 2017-06-27 DIAGNOSIS — N210 Calculus in bladder: Secondary | ICD-10-CM

## 2017-06-27 DIAGNOSIS — N138 Other obstructive and reflux uropathy: Secondary | ICD-10-CM | POA: Diagnosis not present

## 2017-06-27 NOTE — Progress Notes (Signed)
06/27/2017 1:33 PM   David CrutchAndrew J Mcnellis Sr. [birth year] [id number]  Referring provider: Sherron Mondayejan-Sie, S Ahmed, MD [address] [place], [place] [address]   HPI: He recently saw Michiel CowboyShannon McGowan for a 2 cm bladder calculus seen on CT.  A prior CT and 2011 also showed a 2 cm bladder calculus.  He has moderate lower urinary tract symptoms.  He presents for transrectal ultrasound of the prostate for volume/surgical planning.   PMH: Past Medical History:  Diagnosis Date  . History of suicidal ideation 03/2016   After wife passed away  . Kidney stone     Surgical History: Past Surgical History:  Procedure Laterality Date  . STONE EXTRACTION WITH BASKET  2013    Home Medications:  Allergies as of 06/27/2017   No Known Allergies     Medication List        Accurate as of 06/27/17  1:33 PM. Always use your most recent med list.          tamsulosin 0.4 MG Caps capsule Commonly known as:  FLOMAX Take 0.4 mg by mouth.       Allergies: No Known Allergies  Family History: Family History  Problem Relation Age of Onset  . Heart failure Mother   . Prostate cancer Father     Social History:  reports that he has been smoking cigarettes.  He has been smoking about 1.00 pack per day. he has never used smokeless tobacco. He reports that he does not drink alcohol or use drugs.  ROS: No significant change from 06/12/2017  Physical Exam: BP 139/76 (BP Location: Right Arm, Patient Position: Sitting, Cuff Size: Large)   Pulse 90   Ht 5\' 10"  (1.778 m)   Wt 156 lb 3.2 oz (70.9 kg)   BMI 22.41 kg/m   Constitutional:  Alert and oriented, No acute distress. HEENT: [place] AT, moist mucus membranes.  Trachea midline, no masses. Cardiovascular: No clubbing, cyanosis, or edema. Respiratory: Normal respiratory effort, no increased work of breathing. GI: Abdomen is soft, nontender, nondistended, no abdominal masses GU: No CVA tenderness. Skin: No rashes, bruises or suspicious lesions. Lymph:  No cervical or inguinal adenopathy. Neurologic: Grossly intact, no focal deficits, moving all 4 extremities. Psychiatric: Normal mood and affect.  Laboratory Data: Lab Results  Component Value Date   WBC 8.9 06/12/2017   HGB 13.9 06/12/2017   HCT 39.7 06/12/2017   MCV 95 06/12/2017   PLT 372 06/12/2017    Lab Results  Component Value Date   CREATININE 0.94 06/12/2017     Lab Results  Component Value Date   HGBA1C 5.6 02/13/2015    Urinalysis Lab Results  Component Value Date   SPECGRAV 1.020 06/12/2017   PHUR 7.5 06/12/2017   COLORU Yellow 06/12/2017   APPEARANCEUR Cloudy (A) 06/12/2017   LEUKOCYTESUR Trace (A) 06/12/2017   PROTEINUR 2+ (A) 06/12/2017   GLUCOSEU Negative 06/12/2017   KETONESU Negative 06/12/2017   RBCU 2+ (A) 06/12/2017   BILIRUBINUR Negative 06/12/2017   UUROB 0.2 06/12/2017   NITRITE Negative 06/12/2017    Lab Results  Component Value Date   LABMICR See below: 06/12/2017   WBCUA 0-5 06/12/2017   RBCUA 11-30 (A) 06/12/2017   LABEPIT 0-10 06/12/2017   BACTERIA Moderate (A) 06/12/2017    Pertinent Imaging: Transrectal ultrasound of the prostate was performed today.  The ultrasound probe was lubricated and inserted per rectum.  The bladder calculus was easily identified.  There was a small intravesical median lobe.  Prostate  volume was calculated at 59 cc.  Assessment & Plan:    1.  BPH with lower urinary tract symptoms Voiding symptoms have improved on tamsulosin.  Due to his bladder calculus would recommend an outlet procedure at the time of cystolitholapaxy.  Based on prostate size TURP would be the best option.  He would like to check his work schedule.  2.  Bladder calculus As above.    Riki AltesScott C Aavya Shafer, MD  [facility] [address], [address] [place], [place] [address] [phone]

## 2017-07-01 DIAGNOSIS — N401 Enlarged prostate with lower urinary tract symptoms: Principal | ICD-10-CM

## 2017-07-01 DIAGNOSIS — N210 Calculus in bladder: Secondary | ICD-10-CM | POA: Insufficient documentation

## 2017-07-01 DIAGNOSIS — N138 Other obstructive and reflux uropathy: Secondary | ICD-10-CM | POA: Insufficient documentation

## 2017-08-17 ENCOUNTER — Telehealth: Payer: Self-pay | Admitting: Radiology

## 2017-08-17 ENCOUNTER — Other Ambulatory Visit: Payer: Self-pay | Admitting: Radiology

## 2017-08-17 DIAGNOSIS — N401 Enlarged prostate with lower urinary tract symptoms: Principal | ICD-10-CM

## 2017-08-17 DIAGNOSIS — N138 Other obstructive and reflux uropathy: Secondary | ICD-10-CM

## 2017-08-17 DIAGNOSIS — N210 Calculus in bladder: Secondary | ICD-10-CM

## 2017-08-17 NOTE — Telephone Encounter (Signed)
Pt presents to office asking to schedule surgery.  Please advise.

## 2017-08-17 NOTE — Telephone Encounter (Signed)
Orders received. Will schedule pt for surgery with Dr Lonna CobbStoioff on 08/28/2017. Made pt aware & instructions given. Pt voices understanding.

## 2017-08-20 ENCOUNTER — Other Ambulatory Visit: Payer: Self-pay | Admitting: Radiology

## 2017-08-20 ENCOUNTER — Other Ambulatory Visit: Payer: 59

## 2017-08-20 DIAGNOSIS — N401 Enlarged prostate with lower urinary tract symptoms: Secondary | ICD-10-CM

## 2017-08-20 DIAGNOSIS — N210 Calculus in bladder: Secondary | ICD-10-CM

## 2017-08-20 DIAGNOSIS — N138 Other obstructive and reflux uropathy: Secondary | ICD-10-CM

## 2017-08-20 LAB — MICROSCOPIC EXAMINATION: RBC, UA: >30 /hpf — ABNORMAL HIGH (ref 0–?)

## 2017-08-20 LAB — URINALYSIS, COMPLETE
Bilirubin, UA: NEGATIVE
Glucose, UA: NEGATIVE
Ketones, UA: NEGATIVE
NITRITE UA: NEGATIVE
Specific Gravity, UA: 1.02 (ref 1.005–1.030)
Urobilinogen, Ur: 1 mg/dL (ref 0.2–1.0)
pH, UA: 6 (ref 5.0–7.5)

## 2017-08-20 NOTE — Progress Notes (Signed)
Error

## 2017-08-22 LAB — CULTURE, URINE COMPREHENSIVE

## 2017-08-23 ENCOUNTER — Other Ambulatory Visit: Payer: Self-pay

## 2017-08-23 ENCOUNTER — Encounter
Admission: RE | Admit: 2017-08-23 | Discharge: 2017-08-23 | Disposition: A | Discharge status: Home / Self Care | Payer: 59 | Source: Ambulatory Visit | Attending: Urology | Admitting: Urology

## 2017-08-23 HISTORY — DX: Personal history of urinary calculi: Z87.442

## 2017-08-23 NOTE — Pre-Procedure Instructions (Signed)
Dr. Kinnie Feilejansie office notified of need for echocardiagram results and clearance per phone call and Faxed request.

## 2017-08-23 NOTE — Patient Instructions (Signed)
Your procedure is scheduled on: Tues 08/28/17 Report to Day Surgery. To find out your arrival time please call [phone] between 1PM - 3PM on Mon 1?28?19.  Remember: Instructions that are not followed completely may result in serious medical risk, up to and including death, or upon the discretion of your surgeon and anesthesiologist your surgery may need to be rescheduled.     _X__ 1. Do not eat food after midnight the night before your procedure.                 No gum chewing or hard candies. You may drink clear liquids up to 2 hours                 before you are scheduled to arrive for your surgery- DO not drink clear                 liquids within 2 hours of the start of your surgery.                 Clear Liquids include:  water, apple juice without pulp, clear carbohydrate                 drink such as Clearfast of Gartorade, Black Coffee or Tea (Do not add                 anything to coffee or tea).     ___ 2.  No Alcohol for 24 hours before or after surgery.   _X__ 3.  Do Not Smoke or use e-cigarettes For 24 Hours Prior to Your Surgery.                 Do not use any chewable tobacco products for at least 6 hours prior to                 surgery.  ____  4.  Bring all medications with you on the day of surgery if instructed.   _x__  5.  Notify your doctor if there is any change in your medical condition      (cold, fever, infections).     Do not wear jewelry, make-up, hairpins, clips or nail polish. Do not wear lotions, powders, or perfumes. You may wear deodorant. Do not shave 48 hours prior to surgery. Men may shave face and neck. Do not bring valuables to the hospital.    [facility] is not responsible for any belongings or valuables.  Contacts, dentures or bridgework may not be worn into surgery. Leave your suitcase in the car. After surgery it may be brought to your room. For patients admitted to the hospital, discharge time is determined by  your treatment team.   Patients discharged the day of surgery will not be allowed to drive home.   Please read over the following fact sheets that you were given:   __x__ Take these medicines the morning of surgery with A SIP OF WATER:    1. tamsulosin (FLOMAX) 0.4 MG CAPS capsule  2. acetaminophen (TYLENOL) 650 MG CR tablet if needed  3.   4.  5.  6.  ____ Fleet Enema (as directed)   ____ Use CHG Soap as directed  ____ Use inhalers on the day of surgery  ____ Stop metformin 2 days prior to surgery    ____ Take 1/2 of usual insulin dose the night before surgery. No insulin the morning          of surgery.  ____ Stop Coumadin/Plavix/aspirin on  ____ Stop Anti-inflammatories on    ____ Stop supplements until after surgery.    ____ Bring C-Pap to the hospital.

## 2017-08-24 ENCOUNTER — Other Ambulatory Visit: Payer: 59

## 2017-08-27 MED ORDER — CEFAZOLIN SODIUM-DEXTROSE 2-4 GM/100ML-% IV SOLN
2.0000 g | INTRAVENOUS | Status: AC
  Administered 2017-08-28: 2 g via INTRAVENOUS

## 2017-08-28 ENCOUNTER — Ambulatory Visit: Payer: 59 | Admitting: Certified Registered Nurse Anesthetist

## 2017-08-28 ENCOUNTER — Encounter
Admission: RE | Disposition: A | Discharge status: Home / Self Care | Payer: Self-pay | Source: Ambulatory Visit | Attending: Urology

## 2017-08-28 ENCOUNTER — Other Ambulatory Visit: Payer: Self-pay

## 2017-08-28 ENCOUNTER — Encounter: Payer: Self-pay | Admitting: Certified Registered Nurse Anesthetist

## 2017-08-28 ENCOUNTER — Observation Stay
Admission: RE | Admit: 2017-08-28 | Discharge: 2017-08-29 | Disposition: A | Discharge status: Home / Self Care | Payer: 59 | Source: Ambulatory Visit | Attending: Urology | Admitting: Urology

## 2017-08-28 DIAGNOSIS — N210 Calculus in bladder: Secondary | ICD-10-CM | POA: Insufficient documentation

## 2017-08-28 DIAGNOSIS — N138 Other obstructive and reflux uropathy: Secondary | ICD-10-CM | POA: Insufficient documentation

## 2017-08-28 DIAGNOSIS — N401 Enlarged prostate with lower urinary tract symptoms: Principal | ICD-10-CM | POA: Insufficient documentation

## 2017-08-28 DIAGNOSIS — F17210 Nicotine dependence, cigarettes, uncomplicated: Secondary | ICD-10-CM | POA: Diagnosis not present

## 2017-08-28 DIAGNOSIS — N320 Bladder-neck obstruction: Secondary | ICD-10-CM | POA: Insufficient documentation

## 2017-08-28 DIAGNOSIS — Z87442 Personal history of urinary calculi: Secondary | ICD-10-CM | POA: Insufficient documentation

## 2017-08-28 DIAGNOSIS — Z9079 Acquired absence of other genital organ(s): Secondary | ICD-10-CM

## 2017-08-28 HISTORY — PX: TRANSURETHRAL RESECTION OF PROSTATE: SHX73

## 2017-08-28 HISTORY — PX: CYSTOSCOPY WITH LITHOLAPAXY: SHX1425

## 2017-08-28 SURGERY — CYSTOSCOPY, WITH BLADDER CALCULUS LITHOLAPAXY
Anesthesia: General | Site: Prostate | Wound class: Clean Contaminated

## 2017-08-28 MED ORDER — CEFAZOLIN SODIUM-DEXTROSE 2-4 GM/100ML-% IV SOLN
INTRAVENOUS | Status: AC
  Filled 2017-08-28: qty 100

## 2017-08-28 MED ORDER — EPHEDRINE SULFATE 50 MG/ML IJ SOLN
INTRAMUSCULAR | Status: DC | PRN
  Administered 2017-08-28: 10 mg via INTRAVENOUS

## 2017-08-28 MED ORDER — MIDAZOLAM HCL 2 MG/2ML IJ SOLN
INTRAMUSCULAR | Status: AC
  Filled 2017-08-28: qty 2

## 2017-08-28 MED ORDER — MIDAZOLAM HCL 2 MG/2ML IJ SOLN
INTRAMUSCULAR | Status: DC | PRN
  Administered 2017-08-28: 2 mg via INTRAVENOUS

## 2017-08-28 MED ORDER — PROPOFOL 10 MG/ML IV BOLUS
INTRAVENOUS | Status: AC
  Filled 2017-08-28: qty 20

## 2017-08-28 MED ORDER — PHENYLEPHRINE HCL 10 MG/ML IJ SOLN
INTRAMUSCULAR | Status: DC | PRN
  Administered 2017-08-28: 100 ug via INTRAVENOUS

## 2017-08-28 MED ORDER — OXYCODONE-ACETAMINOPHEN 5-325 MG PO TABS
1.0000 | ORAL_TABLET | ORAL | Status: DC | PRN
  Administered 2017-08-28: 2 via ORAL
  Filled 2017-08-28: qty 2

## 2017-08-28 MED ORDER — LACTATED RINGERS IV SOLN
INTRAVENOUS | Status: DC
  Administered 2017-08-28 (×2): via INTRAVENOUS

## 2017-08-28 MED ORDER — DEXTROSE 5 % IV SOLN
1.0000 g | Freq: Three times a day (TID) | INTRAVENOUS | Status: AC
  Administered 2017-08-28 – 2017-08-29 (×2): 1 g via INTRAVENOUS
  Filled 2017-08-28 (×2): qty 10

## 2017-08-28 MED ORDER — DEXAMETHASONE SODIUM PHOSPHATE 10 MG/ML IJ SOLN
INTRAMUSCULAR | Status: AC
  Filled 2017-08-28: qty 1

## 2017-08-28 MED ORDER — LIDOCAINE HCL (PF) 2 % IJ SOLN
INTRAMUSCULAR | Status: AC
  Filled 2017-08-28: qty 10

## 2017-08-28 MED ORDER — SENNOSIDES-DOCUSATE SODIUM 8.6-50 MG PO TABS
2.0000 | ORAL_TABLET | Freq: Every day | ORAL | Status: DC
  Administered 2017-08-28: 2 via ORAL
  Filled 2017-08-28: qty 2

## 2017-08-28 MED ORDER — ONDANSETRON HCL 4 MG/2ML IJ SOLN: 4.0000 mg | INTRAMUSCULAR | Status: DC | PRN

## 2017-08-28 MED ORDER — CEFAZOLIN SODIUM-DEXTROSE 1-4 GM/50ML-% IV SOLN: 1.0000 g | Freq: Three times a day (TID) | INTRAVENOUS | Status: DC

## 2017-08-28 MED ORDER — FENTANYL CITRATE (PF) 100 MCG/2ML IJ SOLN
INTRAMUSCULAR | Status: AC
  Filled 2017-08-28: qty 2

## 2017-08-28 MED ORDER — ROCURONIUM BROMIDE 50 MG/5ML IV SOLN
INTRAVENOUS | Status: AC
  Filled 2017-08-28: qty 1

## 2017-08-28 MED ORDER — FENTANYL CITRATE (PF) 100 MCG/2ML IJ SOLN
INTRAMUSCULAR | Status: DC | PRN
  Administered 2017-08-28 (×6): 25 ug via INTRAVENOUS

## 2017-08-28 MED ORDER — SUGAMMADEX SODIUM 200 MG/2ML IV SOLN
INTRAVENOUS | Status: AC
  Filled 2017-08-28: qty 2

## 2017-08-28 MED ORDER — PHENYLEPHRINE HCL 10 MG/ML IJ SOLN
INTRAMUSCULAR | Status: AC
  Filled 2017-08-28: qty 1

## 2017-08-28 MED ORDER — FENTANYL CITRATE (PF) 100 MCG/2ML IJ SOLN
25.0000 ug | INTRAMUSCULAR | Status: DC | PRN
  Administered 2017-08-28 (×3): 25 ug via INTRAVENOUS

## 2017-08-28 MED ORDER — FAMOTIDINE 20 MG PO TABS
20.0000 mg | ORAL_TABLET | Freq: Once | ORAL | Status: AC
  Administered 2017-08-28: 20 mg via ORAL

## 2017-08-28 MED ORDER — EPHEDRINE SULFATE 50 MG/ML IJ SOLN
INTRAMUSCULAR | Status: AC
  Filled 2017-08-28: qty 1

## 2017-08-28 MED ORDER — PROMETHAZINE HCL 25 MG/ML IJ SOLN: 6.2500 mg | INTRAMUSCULAR | Status: DC | PRN

## 2017-08-28 MED ORDER — FAMOTIDINE 20 MG PO TABS
ORAL_TABLET | ORAL | Status: AC
  Administered 2017-08-28: 20 mg via ORAL
  Filled 2017-08-28: qty 1

## 2017-08-28 MED ORDER — ONDANSETRON HCL 4 MG/2ML IJ SOLN
INTRAMUSCULAR | Status: DC | PRN
  Administered 2017-08-28: 4 mg via INTRAVENOUS

## 2017-08-28 MED ORDER — FENTANYL CITRATE (PF) 100 MCG/2ML IJ SOLN
INTRAMUSCULAR | Status: AC
  Administered 2017-08-28: 25 ug via INTRAVENOUS
  Filled 2017-08-28: qty 2

## 2017-08-28 MED ORDER — PROPOFOL 10 MG/ML IV BOLUS
INTRAVENOUS | Status: DC | PRN
  Administered 2017-08-28: 150 mg via INTRAVENOUS

## 2017-08-28 MED ORDER — LIDOCAINE HCL (CARDIAC) 20 MG/ML IV SOLN
INTRAVENOUS | Status: DC | PRN
  Administered 2017-08-28: 60 mg via INTRAVENOUS

## 2017-08-28 MED ORDER — DEXAMETHASONE SODIUM PHOSPHATE 10 MG/ML IJ SOLN
INTRAMUSCULAR | Status: DC | PRN
  Administered 2017-08-28: 10 mg via INTRAVENOUS

## 2017-08-28 MED ORDER — BELLADONNA ALKALOIDS-OPIUM 16.2-60 MG RE SUPP: 1.0000 | Freq: Four times a day (QID) | RECTAL | Status: DC | PRN

## 2017-08-28 MED ORDER — ONDANSETRON HCL 4 MG/2ML IJ SOLN
INTRAMUSCULAR | Status: AC
  Filled 2017-08-28: qty 2

## 2017-08-28 MED ORDER — LACTATED RINGERS IV SOLN
INTRAVENOUS | Status: DC
  Administered 2017-08-28 – 2017-08-29 (×2): via INTRAVENOUS

## 2017-08-28 SURGICAL SUPPLY — 29 items
ADAPTER IRRIG TUBE 2 SPIKE SOL (ADAPTER) ×8 IMPLANT
BAG DRAIN CYSTO-URO LG1000N (MISCELLANEOUS) ×4 IMPLANT
BAG URO DRAIN 4000ML (MISCELLANEOUS) ×4 IMPLANT
BASKET ZERO TIP 1.9FR (BASKET) IMPLANT
CATH FOL 2WAY LX 24X30 (CATHETERS) IMPLANT
CATH FOLEY 3WAY 30CC 22FR (CATHETERS) ×4 IMPLANT
DRAPE UTILITY 15X26 TOWEL STRL (DRAPES) ×4 IMPLANT
ELECT LOOP 22F BIPOLAR SML (ELECTROSURGICAL)
ELECTRODE LOOP 22F BIPOLAR SML (ELECTROSURGICAL) IMPLANT
FIBER LASER 1000 (Laser) ×4 IMPLANT
GLOVE BIO SURGEON STRL SZ8 (GLOVE) ×4 IMPLANT
GLOVE BIOGEL M 8.0 STRL (GLOVE) IMPLANT
GOWN STANDARD XL  REUSABL (MISCELLANEOUS) ×4 IMPLANT
GOWN STRL REUS W/ TWL LRG LVL3 (GOWN DISPOSABLE) ×4 IMPLANT
GOWN STRL REUS W/ TWL XL LVL3 (GOWN DISPOSABLE) ×2 IMPLANT
GOWN STRL REUS W/TWL LRG LVL3 (GOWN DISPOSABLE) ×4
GOWN STRL REUS W/TWL XL LVL3 (GOWN DISPOSABLE) ×2
HOLDER FOLEY CATH W/STRAP (MISCELLANEOUS) ×4 IMPLANT
KIT TURNOVER CYSTO (KITS) ×4 IMPLANT
LOOP CUT BIPOLAR 24F LRG (ELECTROSURGICAL) ×4 IMPLANT
PACK CYSTO AR (MISCELLANEOUS) ×4 IMPLANT
SET IRRIG Y TYPE TUR BLADDER L (SET/KITS/TRAYS/PACK) ×4 IMPLANT
SET IRRIGATING DISP (SET/KITS/TRAYS/PACK) ×4 IMPLANT
SOL .9 NS 3000ML IRR  AL (IV SOLUTION) ×8
SOL .9 NS 3000ML IRR UROMATIC (IV SOLUTION) ×8 IMPLANT
SYR TOOMEY 50ML (SYRINGE) IMPLANT
SYRINGE IRR TOOMEY STRL 70CC (SYRINGE) ×4 IMPLANT
WATER STERILE IRR 1000ML POUR (IV SOLUTION) ×4 IMPLANT
WATER STERILE IRR 3000ML UROMA (IV SOLUTION) ×32 IMPLANT

## 2017-08-28 NOTE — Anesthesia Procedure Notes (Signed)
Procedure Name: LMA Insertion Date/Time: 08/28/2017 9:48 AM Performed by: Dava NajjarFrazier, Greer Koeppen, CRNA Pre-anesthesia Checklist: Patient identified, Emergency Drugs available, Suction available, Patient being monitored and Timeout performed Patient Re-evaluated:Patient Re-evaluated prior to induction Oxygen Delivery Method: Circle system utilized Preoxygenation: Pre-oxygenation with 100% oxygen Induction Type: IV induction LMA: LMA inserted LMA Size: 5.0 Number of attempts: 1 Placement Confirmation: positive ETCO2 and breath sounds checked- equal and bilateral Tube secured with: Tape Dental Injury: Teeth and Oropharynx as per pre-operative assessment

## 2017-08-28 NOTE — Op Note (Signed)
Preoperative diagnosis: 1. Bladder outlet obstruction secondary to BPH 2. Bladder calculus-2 cm  Postoperative diagnosis:  1. Bladder outlet obstruction secondary to BPH 2. Bladder calculus-2 cm  Procedure:  1. Cystoscopy 2. Transurethral resection of the prostate 3. Cystolitholapaxy  Surgeon: Verna CzechScott C. Stoioff M.D.  Anesthesia: General  Complications: None  EBL: Minimal  Specimens: 1. Prostate chips   Indication: David Crutchndrew J Scherer Sr. is a patient with bladder outlet obstruction secondary to benign prostatic hyperplasia and a 2 cm bladder calculus. After reviewing the management options for treatment, he elected to proceed with the above surgical procedure(s). We have discussed the potential benefits and risks of the procedure, side effects of the proposed treatment, the likelihood of the patient achieving the goals of the procedure, and any potential problems that might occur during the procedure or recuperation. Informed consent has been obtained.  Description of procedure:  The patient was taken to the operating room and general anesthesia was induced.  The patient was placed in the dorsal lithotomy position, prepped and draped in the usual sterile fashion, and preoperative antibiotics were administered. A preoperative time-out was performed.   Cystourethroscopy was performed.  The patient's urethra was examined and was normal without stricture.  There was mild lateral lobe enlargement and moderate bladder neck elevation.  There was a nodule of the obstructing prostate tissue arising posteriorly in the mid prostate.  The bladder was then systematically examined in its entirety.  The bladder calculus was easily identified.  No solid or papillary tumors were noted.  There was moderate inflammation of the trigone and bladder neck secondary to the stone.  The ureteral orifices difficult to visualize however the trigone was well away from the bladder neck.  The prostate adenoma was  then resected utilizing loop cautery resection with the monopolar/bipolar cutting loop.  The prostate adenoma from the bladder neck back to the verumontanum was resected beginning at the six o'clock position including the nodule of obstructing prostate tissue previously noted.  The resection was then extended to include the right and left lobes of the prostate and anterior prostate. Care was taken not to resect distal to the verumontanum.  Hemostasis was then achieved with the cautery and the bladder was emptied and reinspected with no significant bleeding noted.    The resectoscope was then replaced with the laser bridge and a 1000 m holmium laser fiber was placed through the scope.  The bladder stone was then fragmented starting at settings of 0.2 J / 20 Hz and subsequently increasing to 1.0 J / 50 Hz.  All fragments were removed with irrigation.  The resectoscope was replaced.  Once all sizable fragments were removed and hemostasis was noted to be adequate the resectoscope was removed.  A 22 French 3 way catheter was then placed into the bladder and placed on continuous bladder irrigation.  The CBI effluent was pink tinged.  The patient appeared to tolerate the procedure well and without complications.  After anesthetic reversal he was transported to the PACU in satisfactory condition.

## 2017-08-28 NOTE — Anesthesia Post-op Follow-up Note (Signed)
Anesthesia QCDR form completed.        

## 2017-08-28 NOTE — Anesthesia Preprocedure Evaluation (Signed)
Anesthesia Evaluation  Patient identified by MRN, date of birth, ID band Patient awake    Reviewed: Allergy & Precautions, H&P , NPO status , Patient's Chart, lab work & pertinent test results, reviewed documented beta blocker date and time   History of Anesthesia Complications Negative for: history of anesthetic complications  Airway Mallampati: II  TM Distance: >3 FB Neck ROM: full    Dental  (+) Upper Dentures, Lower Dentures   Pulmonary neg shortness of breath, neg sleep apnea, neg COPD, neg recent URI, Current Smoker,           Cardiovascular Exercise Tolerance: Good (-) hypertension(-) angina(-) CAD, (-) Past MI, (-) Cardiac Stents and (-) CABG (-) dysrhythmias + Valvular Problems/Murmurs      Neuro/Psych negative neurological ROS  negative psych ROS   GI/Hepatic negative GI ROS, Neg liver ROS,   Endo/Other  negative endocrine ROS  Renal/GU negative Renal ROS  negative genitourinary   Musculoskeletal   Abdominal   Peds  Hematology negative hematology ROS (+)   Anesthesia Other Findings Past Medical History: No date: History of kidney stones 03/2016: History of suicidal ideation     Comment:  After wife passed away   Reproductive/Obstetrics negative OB ROS                             Anesthesia Physical Anesthesia Plan  ASA: II  Anesthesia Plan: General   Post-op Pain Management:    Induction: Intravenous  PONV Risk Score and Plan: 1 and Ondansetron and Dexamethasone  Airway Management Planned: LMA  Additional Equipment:   Intra-op Plan:   Post-operative Plan: Extubation in OR  Informed Consent: I have reviewed the patients History and Physical, chart, labs and discussed the procedure including the risks, benefits and alternatives for the proposed anesthesia with the patient or authorized representative who has indicated his/her understanding and acceptance.   Dental  Advisory Given  Plan Discussed with: Anesthesiologist, CRNA and Surgeon  Anesthesia Plan Comments:         Anesthesia Quick Evaluation

## 2017-08-28 NOTE — H&P (Signed)
   08/28/2017 7:24 AM   David CrutchAndrew J Wyeth Sr. Feb 08, 1961 [id number]  Referring provider: No referring provider defined for this encounter.   HPI: 57 year old male with BPH and lower urinary tract symptoms.  He also has a 2 cm bladder calculus.  Prostate volume by ultrasound was 59 cc.  He presents for TURP and cystolitholapaxy.   PMH: Past Medical History:  Diagnosis Date  . History of kidney stones   . History of suicidal ideation 03/2016   After wife passed away    Surgical History: Past Surgical History:  Procedure Laterality Date  . STONE EXTRACTION WITH BASKET  2013    Home Medications:  Reviewed  Allergies: No Known Allergies  Family History: Family History  Problem Relation Age of Onset  . Heart failure Mother   . Prostate cancer Father     Social History:  reports that he has been smoking cigarettes.  He has been smoking about 1.00 pack per day. he has never used smokeless tobacco. He reports that he does not drink alcohol or use drugs.  ROS: No significant change from 06/28/2015  Physical Exam: There were no vitals taken for this visit.  Constitutional:  Alert and oriented, No acute distress. HEENT: [place] AT, moist mucus membranes.  Trachea midline, no masses. Cardiovascular: No clubbing, cyanosis, or edema.  CV RRR Respiratory: Normal respiratory effort, no increased work of breathing.  Lungs clear GI: Abdomen is soft, nontender, nondistended, no abdominal masses GU: No CVA tenderness.   Skin: No rashes, bruises or suspicious lesions. Lymph: No cervical or inguinal adenopathy. Neurologic: Grossly intact, no focal deficits, moving all 4 extremities. Psychiatric: Normal mood and affect.  Laboratory Data: Lab Results  Component Value Date   WBC 8.9 06/12/2017   HGB 13.9 06/12/2017   HCT 39.7 06/12/2017   MCV 95 06/12/2017   PLT 372 06/12/2017    Lab Results  Component Value Date   CREATININE 0.94 06/12/2017      Lab Results  Component Value Date    HGBA1C 5.6 02/13/2015     Assessment & Plan:   57 year old male with BPH and a 2 cm bladder calculus for TURP/cystolitholapaxy.  The procedure has been discussed in detail including potential risk of bleeding, infection, urinary incontinence, retrograde ejaculation, urethral stricture and bladder neck contracture.  The possibility of persistent voiding symptoms were discussed.  He indicated all questions were answered and he desires to proceed.  Riki AltesScott C Calogero Geisen, MD  [facility] [address], [address] [place], [place] [address] [phone]

## 2017-08-28 NOTE — Interval H&P Note (Signed)
History and Physical Interval Note:  08/28/2017 9:34 AM  David CrutchAndrew J Gutmann Sr.  has presented today for surgery, with the diagnosis of BPH with obstruction/LUTS, Bladder calculus  The various methods of treatment have been discussed with the patient and family. After consideration of risks, benefits and other options for treatment, the patient has consented to  Procedure(s): CYSTOSCOPY WITH LITHOLAPAXY (N/A) TRANSURETHRAL RESECTION OF THE PROSTATE (TURP) (N/A) as a surgical intervention .  The patient's history has been reviewed, patient examined, no change in status, stable for surgery.  I have reviewed the patient's chart and labs.  Questions were answered to the patient's satisfaction.     Mikahla Wisor C [facility]

## 2017-08-28 NOTE — Transfer of Care (Signed)
Immediate Anesthesia Transfer of Care Note  Patient: David Crutchndrew J Gisler Sr.  Procedure(s) Performed: CYSTOSCOPY WITH LITHOLAPAXY (N/A Bladder) TRANSURETHRAL RESECTION OF THE PROSTATE (TURP) (N/A Prostate)  Patient Location: PACU  Anesthesia Type:General  Level of Consciousness: drowsy  Airway & Oxygen Therapy: Patient Spontanous Breathing and Patient connected to nasal cannula oxygen  Post-op Assessment: Report given to RN and Post -op Vital signs reviewed and stable  Post vital signs: Reviewed and stable  Last Vitals:  Vitals:   08/28/17 0742  BP: 110/62  Pulse: 83  Resp: 16  Temp: (!) 36.1 C  SpO2: 99%    Last Pain:  Vitals:   08/28/17 0742  TempSrc: Tympanic         Complications: No apparent anesthesia complications

## 2017-08-29 ENCOUNTER — Encounter: Payer: Self-pay | Admitting: Urology

## 2017-08-29 DIAGNOSIS — N401 Enlarged prostate with lower urinary tract symptoms: Secondary | ICD-10-CM | POA: Diagnosis not present

## 2017-08-29 LAB — SURGICAL PATHOLOGY

## 2017-08-29 MED ORDER — OXYCODONE-ACETAMINOPHEN 5-325 MG PO TABS: 1.0000 | ORAL_TABLET | ORAL | 0 refills | Status: DC | PRN

## 2017-08-29 MED ORDER — SULFAMETHOXAZOLE-TRIMETHOPRIM 800-160 MG PO TABS: 1.0000 | ORAL_TABLET | Freq: Two times a day (BID) | ORAL | 0 refills | Status: DC

## 2017-08-29 NOTE — Anesthesia Postprocedure Evaluation (Signed)
Anesthesia Post Note  Patient: David Crutchndrew J Henzler Sr.  Procedure(s) Performed: CYSTOSCOPY WITH LITHOLAPAXY (N/A Bladder) TRANSURETHRAL RESECTION OF THE PROSTATE (TURP) (N/A Prostate)  Patient location during evaluation: PACU Anesthesia Type: General Level of consciousness: awake and alert Pain management: pain level controlled Vital Signs Assessment: post-procedure vital signs reviewed and stable Respiratory status: spontaneous breathing, nonlabored ventilation, respiratory function stable and patient connected to nasal cannula oxygen Cardiovascular status: blood pressure returned to baseline and stable Postop Assessment: no apparent nausea or vomiting Anesthetic complications: no     Last Vitals:  Vitals:   08/29/17 0048 08/29/17 0506  BP: (!) 105/45 (!) 116/55  Pulse: (!) 55 70  Resp: 20 19  Temp: (!) 36.3 C 36.6 C  SpO2: 98% 98%    Last Pain:  Vitals:   08/29/17 0506  TempSrc: Oral  PainSc:                  Lenard SimmerAndrew Oluwatomiwa Kinyon

## 2017-08-29 NOTE — Progress Notes (Signed)
Shane CrutchAndrew J Comer Sr.  A and O x 4. VSS. Pt tolerating diet well. No complaints of pain or nausea. IV removed intact, prescriptions given. Pt voiced understanding of discharge instructions with no further questions. Pt discharged via wheelchair with nurse aide.  Orvil FeilAbbie Leroi Haque MSN, RN-BC  Allergies as of 08/29/2017   No Known Allergies     Medication List    STOP taking these medications   acetaminophen 650 MG CR tablet Commonly known as:  TYLENOL   tamsulosin 0.4 MG Caps capsule Commonly known as:  FLOMAX     TAKE these medications   oxyCODONE-acetaminophen 5-325 MG tablet Commonly known as:  PERCOCET/ROXICET Take 1-2 tablets by mouth every 4 (four) hours as needed for moderate pain.   sulfamethoxazole-trimethoprim 800-160 MG tablet Commonly known as:  BACTRIM DS,SEPTRA DS Take 1 tablet by mouth 2 (two) times daily.       Vitals:   08/29/17 0506 08/29/17 1202  BP: (!) 116/55 116/74  Pulse: 70 72  Resp: 19   Temp: 97.8 F (36.6 C) (!) 97.4 F (36.3 C)  SpO2: 98% 100%

## 2017-08-29 NOTE — Progress Notes (Signed)
POD #1  No complaints; did not require manual irrigation  Exam: CBI effluent clear on low flow  Impression: Doing well status post TURP/cystolitholapaxy  Plan: Discontinue CBI.  Foley out later today if no significant hematuria.

## 2017-08-29 NOTE — Progress Notes (Signed)
Per Dr. Lonna CobbStoioff clamp CBI at this time. Will continue to monitor urine color and for signs of pain or discomfort.

## 2017-08-29 NOTE — Discharge Summary (Signed)
Date of admission: 08/28/2017  Date of discharge: 08/29/2017  Admission diagnosis:  1.  BPH with lower urinary tract symptoms 2.  Bladder calculus  Discharge diagnosis: 1.  BPH with lower urinary tract symptoms 2.  Bladder calculus  Secondary diagnoses:  Patient Active Problem List   Diagnosis Date Noted  . S/P TURP 08/28/2017  . BPH with obstruction/lower urinary tract symptoms 07/01/2017  . Calculus of bladder 07/01/2017  . Chest pain 02/13/2015    History and Physical: For full details, please see admission history and physical. Briefly, David Duffy. is a 57 y.o. year old patient with lower urinary tract symptoms and a 2 cm bladder calculus.  He was admitted for TURP and cystolitholapaxy.   Hospital Course: Patient tolerated the procedure well and was placed on continuous bladder irrigation.  He was then transferred to the floor after an uneventful PACU stay.  His hospital course was uncomplicated.  On POD#1 his CBI was discontinued.  His urine remained clear and his catheter was removed.  He was voiding without difficulty and his urine was only blood-tinged.  he had met discharge criteria: was eating a regular diet, was up and ambulating independently,  pain was well controlled, was voiding without a catheter, and was ready to for discharge.   Laboratory values:  No results for input(s): WBC, HGB, HCT in the last 72 hours. No results for input(s): NA, K, CL, CO2, GLUCOSE, BUN, CREATININE, CALCIUM in the last 72 hours. No results for input(s): LABPT, INR in the last 72 hours. No results for input(s): LABURIN in the last 72 hours. Results for orders placed or performed in visit on 08/20/17  Microscopic Examination     Status: Abnormal   Collection Time: 08/20/17  1:58 PM  Result Value Ref Range Status   WBC, UA 0-5 0 - 5 /hpf Final   RBC, UA >30 (H) 0 - 2 /hpf Final   Epithelial Cells (non renal) 0-10 0 - 10 /hpf Final   Mucus, UA Present (A) Not Estab. Final   Bacteria, UA  Few (A) None seen/Few Final  CULTURE, URINE COMPREHENSIVE     Status: None   Collection Time: 08/20/17  2:10 PM  Result Value Ref Range Status   Urine Culture, Comprehensive Final report  Final   Organism ID, Bacteria Comment  Final    Comment: Mixed urogenital flora 1,000 Colonies/mL     Disposition: Home  Discharge instruction: The patient was instructed to be ambulatory but told to refrain from heavy lifting, strenuous activity, or driving.  Refer to separate discharge instructions for details  Discharge medications:  Allergies as of 08/29/2017   No Known Allergies     Medication List    STOP taking these medications   acetaminophen 650 MG CR tablet Commonly known as:  TYLENOL   tamsulosin 0.4 MG Caps capsule Commonly known as:  FLOMAX     TAKE these medications   oxyCODONE-acetaminophen 5-325 MG tablet Commonly known as:  PERCOCET/ROXICET Take 1-2 tablets by mouth every 4 (four) hours as needed for moderate pain.   sulfamethoxazole-trimethoprim 800-160 MG tablet Commonly known as:  BACTRIM DS,SEPTRA DS Take 1 tablet by mouth 2 (two) times daily.       Followup:  Follow-up Information    Abbie Sons, MD. Go on 09/27/2017.   Specialty:  Urology Why:  Dr. Bernardo Heater, Thursday, 2/28 at 11:15 a.m. (arrive 11:00 a.m.)  [phone] Contact information: [place] [address]  [phone]           

## 2017-09-03 ENCOUNTER — Encounter: Payer: Self-pay | Admitting: Radiology

## 2017-09-03 ENCOUNTER — Telehealth: Payer: Self-pay | Admitting: Radiology

## 2017-09-03 NOTE — Telephone Encounter (Signed)
Pt requests return to work note. When can pt return to work? Any restrictions?

## 2017-09-03 NOTE — Telephone Encounter (Signed)
Per Dr Lonna CobbStoioff, pt may return to work this week with restrictions including no lifting greater than 10 lbs & no strenuous activity for 1 month. Letter will be left at front desk for pt.

## 2017-09-06 ENCOUNTER — Encounter: Payer: Self-pay | Admitting: Urology

## 2017-09-27 ENCOUNTER — Encounter: Payer: Self-pay | Admitting: Urology

## 2017-09-27 ENCOUNTER — Ambulatory Visit (INDEPENDENT_AMBULATORY_CARE_PROVIDER_SITE_OTHER): Payer: 59 | Admitting: Urology

## 2017-09-27 VITALS — BP 136/82 | HR 85

## 2017-09-27 DIAGNOSIS — N400 Enlarged prostate without lower urinary tract symptoms: Secondary | ICD-10-CM

## 2017-09-27 DIAGNOSIS — Z9079 Acquired absence of other genital organ(s): Secondary | ICD-10-CM

## 2017-09-27 LAB — BLADDER SCAN AMB NON-IMAGING

## 2017-09-27 NOTE — Progress Notes (Signed)
   09/27/2017 11:40 AM   David CrutchAndrew J Rahm Sr. [birth year] [id number]  Referring provider: Sherron Mondayejan-Sie, S Ahmed, MD [address] [place], [place] [address]  Chief Complaint  Patient presents with  . Post-op Follow-up    HPI: 57 year old male status post TURP and cystolitholapaxy on 08/28/2017.  He had no postoperative problems and states he is doing well.  He is voiding with an excellent stream and denies urinary incontinence.  He has had no recent gross hematuria.  He had predominantly median lobe tissue.  His bladder calculus was 2 cm.  Pathology showed 5 g of resected tissue with BPH.   PMH: Past Medical History:  Diagnosis Date  . History of kidney stones   . History of suicidal ideation 03/2016   After wife passed away    Surgical History: Past Surgical History:  Procedure Laterality Date  . CYSTOSCOPY WITH LITHOLAPAXY N/A 08/28/2017   Procedure: CYSTOSCOPY WITH LITHOLAPAXY;  Surgeon: Riki AltesStoioff, Mavric Cortright C, MD;  Location: [facility] ORS;  Service: Urology;  Laterality: N/A;  . STONE EXTRACTION WITH BASKET  2013  . TRANSURETHRAL RESECTION OF PROSTATE N/A 08/28/2017   Procedure: TRANSURETHRAL RESECTION OF THE PROSTATE (TURP);  Surgeon: Riki AltesStoioff, Helen Winterhalter C, MD;  Location: [facility] ORS;  Service: Urology;  Laterality: N/A;    Home Medications:  Allergies as of 09/27/2017   No Known Allergies     Medication List    as of 09/27/2017 11:40 AM   You have not been prescribed any medications.     Allergies: No Known Allergies  Family History: Family History  Problem Relation Age of Onset  . Heart failure Mother   . Prostate cancer Father     Social History:  reports that he has been smoking cigarettes.  He has been smoking about 1.00 pack per day. he has never used smokeless tobacco. He reports that he does not drink alcohol or use drugs.  ROS: As per the HPI  Physical Exam: BP 136/82   Pulse 85   Constitutional:  Alert and oriented, No acute distress.   Laboratory Data: Lab Results    Component Value Date   WBC 8.9 06/12/2017   HGB 13.9 06/12/2017   HCT 39.7 06/12/2017   MCV 95 06/12/2017   PLT 372 06/12/2017    Lab Results  Component Value Date   CREATININE 0.94 06/12/2017    Lab Results  Component Value Date   HGBA1C 5.6 02/13/2015     Assessment & Plan:   Doing well status post TURP/cystolitholapaxy.  PVR by bladder scan today was 18 mL.  He may return to work without restrictions.  Follow-up visit 4 months.   1. S/P TURP   2. Benign prostatic hyperplasia, unspecified whether lower urinary tract symptoms present  - BLADDER SCAN AMB NON-IMAGING    Riki AltesScott C Damiel Barthold, MD  [facility] [address], [address] [place], [place] [address] [phone]

## 2018-01-23 ENCOUNTER — Encounter: Payer: Self-pay | Admitting: Urology

## 2018-01-23 ENCOUNTER — Ambulatory Visit: Payer: 59 | Admitting: Urology

## 2024-06-12 ENCOUNTER — Ambulatory Visit: Payer: Self-pay

## 2024-06-12 DIAGNOSIS — Z113 Encounter for screening for infections with a predominantly sexual mode of transmission: Secondary | ICD-10-CM

## 2024-06-12 LAB — HM HIV SCREENING LAB: HM HIV Screening: NEGATIVE

## 2024-06-12 NOTE — Progress Notes (Signed)
 Pt is here fro STD screening. Condoms given. Wilkie Drought, RN.

## 2024-06-12 NOTE — Progress Notes (Signed)
 [facility] Department STI clinic [address], [address] [place] [address] Main phone: [phone]  STI screening visit  Subjective:  David UEMURA Sr. is a 63 y.o. male being seen today for an STI screening visit. The patient reports they do have symptoms.    Patient has the following medical conditions:  Patient Active Problem List   Diagnosis Date Noted   S/P TURP 08/28/2017   BPH with obstruction/lower urinary tract symptoms 07/01/2017   Calculus of bladder 07/01/2017   Chest pain 02/13/2015   Chief Complaint  Patient presents with   SEXUALLY TRANSMITTED DISEASE    HPI Patient reports  he's been having discomfort with urination for the last month in a half. He reports having urinary frequency, pressure with urination, internal penile itching, and dysuria. Pt denies any sores, bumps, or discharge.     See flowsheet for further details and programmatic requirements  Hyperlink available at the top of the signed note in blue.  Flow sheet content below:  Pregnancy Intention Screening Does the patient want to become pregnant in the next year?: N/A Does the patient's partner want to become pregnant in the next year?: No Would the patient like to discuss contraceptive options today?: N/A All Patients Anyone smoke around pt and/or pt's children?: No Anyone smoke inside pt's house?: Yes Anyone smoke inside car?: Yes Anyone smoke inside the workplace?: Yes Reason For STD Screen STD Screening: Has symptoms Have you ever had an STD?: Yes History of Antibiotic use in the past 2 weeks?: No STD Symptoms Genital Itching: Yes (sometimes not all the times) Lower abdominal pain: Yes Discharge: No Dysuria: Yes (burining with urination - at the end of urinary stream) Genital ulcer / lesion: No Rash: No Pain with sex: Yes (Pt states the last sexual intercourse was reallt uncomfortable) Risk Factors for Hep B Household, sexual, or needle sharing contact of  a person infected with Hep B: No Sexual contact with a person who uses drugs not as prescribed?: No Currently or Ever used drugs not as prescribed: No HIV Positive: No PRep Patient: No Men who have sex with men: No Have Hepatitis C: No History of Incarceration: No History of Homeslessness?: No Anal sex following anal drug use?: No Risk Factors for Hep C Currently using drugs not as prescribed: No Sexual partner(s) currently using drugs as not prescribed: No History of drug use: No HIV Positive: No Advise Advised client to quit or stay quit. : Yes Assess # of cigarettes per day:  (1 pack of cigarettes) Is patient ready to quit in next 30 days?: Yes Abuse History Has patient ever been abused physically?: No Has patient ever been abused sexually?: No Does patient feel they have a problem with Anxiety?: No Does patient feel they have a problem with Depression?: No Counseling Patient counseled to use condoms with all sex: Condoms given RTC in 2-3 weeks for test results: Yes Clinic will call if test results abnormal before test result appt.: Yes Immunizations: Referred Test results given to patient Patient counseled to use condoms with all sex: Condoms given  Screening for MPX risk:  Unexplained rash?  No   MSM?  No   Multiple or anonymous sex partners?  No   Any close or sexual contact with a person  diagnosed with MPX?  No   Any outside the [place]  where MPX is endemic?  No   High clinical suspicion for MPX?    -Unlikely to be chickenpox    -  Lymphadenopathy    -Rash that presents in same phase of       evolution on any given body part  No   STI screening history: Last HIV test per patient/review of record was No results found for: HMHIVSCREEN No results found for: HIV  Last HEPC test per patient/review of record was No results found for: HMHEPCSCREEN No components found for: HEPC   Last HEPB test per patient/review of record was No components found for:  HMHEPBSCREEN   Fertility: Does the patient or their partner desires a pregnancy in the next year? No  There is no immunization history for the selected administration types on file for this patient.  The following portions of the patient's history were reviewed and updated as appropriate: allergies, current medications, past medical history, past social history, past surgical history and problem list.  Objective:  There were no vitals filed for this visit.  Physical Exam Vitals and nursing note reviewed. Exam conducted with a chaperone present Blondie, RN).  Constitutional:      Appearance: Normal appearance.  HENT:     Head: Normocephalic and atraumatic.     Mouth/Throat:     Mouth: Mucous membranes are moist. No oral lesions.     Pharynx: Oropharynx is clear. No oropharyngeal exudate or posterior oropharyngeal erythema.  Eyes:     General:        Right eye: No discharge.        Left eye: No discharge.     Conjunctiva/sclera:     Right eye: Right conjunctiva is not injected. No exudate.    Left eye: Left conjunctiva is not injected. No exudate. Pulmonary:     Effort: Pulmonary effort is normal.  Abdominal:     General: Abdomen is flat. There is no distension.  Genitourinary:    Pubic Area: No rash or pubic lice.      Penis: Normal and circumcised. No tenderness, discharge, swelling or lesions.      Testes: Normal.     Epididymis:     Right: Normal. No mass or tenderness.     Left: Normal. No mass or tenderness.     Rectum: Tenderness: no lesions or discharge.     Comments: No discharge noted  Musculoskeletal:        General: Normal range of motion.  Lymphadenopathy:     Head:     Right side of head: No posterior auricular adenopathy.     Left side of head: No posterior auricular adenopathy.     Cervical: No cervical adenopathy.     Lower Body: No right inguinal adenopathy. No left inguinal adenopathy.  Skin:    General: Skin is warm and dry.     Findings: No lesion  or rash.  Neurological:     General: No focal deficit present.     Mental Status: He is alert and oriented to person, place, and time.  Psychiatric:        Mood and Affect: Mood normal.        Behavior: Behavior normal.     Assessment and Plan:  [name] is a 63 y.o. male presenting to the [facility] for STI screening  1. Screening for venereal disease (Primary)  - Pt has internal penile itching, and dysuria but declined Gram stain. - Syphilis Serology, [facility] - HIV [facility] - Chlamydia/GC NAA, Confirmation   Patient does have STI symptoms Patient accepted the following screenings: urine CT/GC, HIV,  and RPR Patient meets criteria for HepB screening? No. Ordered? No applicable Patient meets criteria for HepC screening? No. Ordered? not applicable Recommended condom use with all sex Discussed importance of condom use for STI prevention  Treat positive test results per standing order. Discussed time line for [facility] results and that patient will be called with positive results and encouraged patient to call if he had not heard in 2 weeks Recommended repeat testing in 3 months with positive results. Recommended returning for continued or worsening symptoms.   Return PRN, for STI screening.  No future appointments.  Hardin GORMAN Pouch, NP

## 2024-06-15 LAB — CHLAMYDIA/GC NAA, CONFIRMATION
Chlamydia trachomatis, NAA: NEGATIVE
Neisseria gonorrhoeae, NAA: NEGATIVE
